# Patient Record
Sex: Male | Born: 1965 | Race: Black or African American | Hispanic: No | Marital: Married | Smoking: Current every day smoker
Health system: Southern US, Community
[De-identification: ages and names within clinical notes are randomized; demographics above are authoritative.]

## PROBLEM LIST (undated history)

## (undated) DIAGNOSIS — M5030 Other cervical disc degeneration, unspecified cervical region: Secondary | ICD-10-CM

## (undated) DIAGNOSIS — E785 Hyperlipidemia, unspecified: Secondary | ICD-10-CM

## (undated) DIAGNOSIS — G8929 Other chronic pain: Secondary | ICD-10-CM

## (undated) DIAGNOSIS — N50819 Testicular pain, unspecified: Secondary | ICD-10-CM

## (undated) DIAGNOSIS — I10 Essential (primary) hypertension: Secondary | ICD-10-CM

## (undated) DIAGNOSIS — K209 Esophagitis, unspecified: Secondary | ICD-10-CM

## (undated) DIAGNOSIS — Z720 Tobacco use: Secondary | ICD-10-CM

## (undated) DIAGNOSIS — M545 Low back pain, unspecified: Secondary | ICD-10-CM

## (undated) DIAGNOSIS — M47812 Spondylosis without myelopathy or radiculopathy, cervical region: Secondary | ICD-10-CM

## (undated) DIAGNOSIS — K2270 Barrett's esophagus without dysplasia: Secondary | ICD-10-CM

## (undated) HISTORY — DX: Esophagitis, unspecified: K20.9

## (undated) HISTORY — DX: Low back pain: M54.5

## (undated) HISTORY — DX: Essential (primary) hypertension: I10

## (undated) HISTORY — DX: Low back pain, unspecified: M54.50

## (undated) HISTORY — DX: Testicular pain, unspecified: N50.819

## (undated) HISTORY — DX: Hyperlipidemia, unspecified: E78.5

## (undated) HISTORY — DX: Other cervical disc degeneration, unspecified cervical region: M50.30

## (undated) HISTORY — DX: Tobacco use: Z72.0

## (undated) HISTORY — DX: Other chronic pain: G89.29

## (undated) HISTORY — DX: Barrett's esophagus without dysplasia: K22.70

## (undated) HISTORY — DX: Spondylosis without myelopathy or radiculopathy, cervical region: M47.812
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
## 2001-09-07 ENCOUNTER — Emergency Department (HOSPITAL_COMMUNITY): Admission: EM | Admit: 2001-09-07 | Discharge: 2001-09-07 | Payer: Self-pay | Admitting: Emergency Medicine

## 2002-01-20 ENCOUNTER — Emergency Department (HOSPITAL_COMMUNITY): Admission: EM | Admit: 2002-01-20 | Discharge: 2002-01-20 | Payer: Self-pay | Admitting: Emergency Medicine

## 2002-07-12 ENCOUNTER — Emergency Department (HOSPITAL_COMMUNITY): Admission: EM | Admit: 2002-07-12 | Discharge: 2002-07-13 | Payer: Self-pay | Admitting: Emergency Medicine

## 2003-11-22 ENCOUNTER — Encounter: Admission: RE | Admit: 2003-11-22 | Discharge: 2003-11-22 | Payer: Self-pay | Admitting: Internal Medicine

## 2005-05-27 ENCOUNTER — Emergency Department (HOSPITAL_COMMUNITY): Admission: EM | Admit: 2005-05-27 | Discharge: 2005-05-27 | Payer: Self-pay | Admitting: Emergency Medicine

## 2005-06-07 ENCOUNTER — Emergency Department (HOSPITAL_COMMUNITY): Admission: EM | Admit: 2005-06-07 | Discharge: 2005-06-08 | Payer: Self-pay | Admitting: Emergency Medicine

## 2005-12-21 ENCOUNTER — Emergency Department (HOSPITAL_COMMUNITY): Admission: EM | Admit: 2005-12-21 | Discharge: 2005-12-21 | Payer: Self-pay | Admitting: Emergency Medicine

## 2008-05-24 ENCOUNTER — Ambulatory Visit: Payer: Self-pay | Admitting: Diagnostic Radiology

## 2008-05-24 ENCOUNTER — Telehealth: Payer: Self-pay | Admitting: Internal Medicine

## 2008-05-24 ENCOUNTER — Ambulatory Visit (HOSPITAL_BASED_OUTPATIENT_CLINIC_OR_DEPARTMENT_OTHER): Admission: RE | Admit: 2008-05-24 | Discharge: 2008-05-24 | Payer: Self-pay | Admitting: Internal Medicine

## 2008-05-24 ENCOUNTER — Ambulatory Visit: Payer: Self-pay | Admitting: Internal Medicine

## 2008-05-24 DIAGNOSIS — R5381 Other malaise: Secondary | ICD-10-CM

## 2008-05-24 DIAGNOSIS — A64 Unspecified sexually transmitted disease: Secondary | ICD-10-CM | POA: Insufficient documentation

## 2008-05-24 DIAGNOSIS — F17200 Nicotine dependence, unspecified, uncomplicated: Secondary | ICD-10-CM

## 2008-05-24 DIAGNOSIS — R5383 Other fatigue: Secondary | ICD-10-CM

## 2008-05-24 LAB — CONVERTED CEMR LAB
ALT: 42 units/L (ref 0–53)
AST: 28 units/L (ref 0–37)
Albumin: 4.3 g/dL (ref 3.5–5.2)
Alkaline Phosphatase: 66 units/L (ref 39–117)
BUN: 10 mg/dL (ref 6–23)
Basophils Absolute: 0 10*3/uL (ref 0.0–0.1)
Basophils Relative: 0.4 % (ref 0.0–3.0)
Bilirubin, Direct: 0.1 mg/dL (ref 0.0–0.3)
CO2: 32 meq/L (ref 19–32)
Calcium: 9.5 mg/dL (ref 8.4–10.5)
Chloride: 105 meq/L (ref 96–112)
Cholesterol: 253 mg/dL — ABNORMAL HIGH (ref 0–200)
Creatinine, Ser: 0.9 mg/dL (ref 0.4–1.5)
Direct LDL: 192.3 mg/dL
Eosinophils Absolute: 0.1 10*3/uL (ref 0.0–0.7)
Eosinophils Relative: 2.2 % (ref 0.0–5.0)
Free T4: 0.9 ng/dL (ref 0.6–1.6)
GFR calc non Af Amer: 119.94 mL/min (ref >60)
Glucose, Bld: 96 mg/dL (ref 70–99)
HCT: 42.5 % (ref 39.0–52.0)
HDL: 27.2 mg/dL — ABNORMAL LOW (ref >39.00)
Hemoglobin: 14.5 g/dL (ref 13.0–17.0)
Lymphocytes Relative: 37.3 % (ref 12.0–46.0)
Lymphs Abs: 2.1 10*3/uL (ref 0.7–4.0)
MCHC: 34.2 g/dL (ref 30.0–36.0)
MCV: 82.5 fL (ref 78.0–100.0)
Monocytes Absolute: 0.3 10*3/uL (ref 0.1–1.0)
Monocytes Relative: 5.2 % (ref 3.0–12.0)
Neutro Abs: 3.2 10*3/uL (ref 1.4–7.7)
Neutrophils Relative %: 54.9 % (ref 43.0–77.0)
Platelets: 238 10*3/uL (ref 150.0–400.0)
Potassium: 4.1 meq/L (ref 3.5–5.1)
RBC: 5.15 M/uL (ref 4.22–5.81)
RDW: 14 % (ref 11.5–14.6)
Sodium: 142 meq/L (ref 135–145)
TSH: 0.92 microintl units/mL (ref 0.35–5.50)
Total Bilirubin: 0.8 mg/dL (ref 0.3–1.2)
Total CHOL/HDL Ratio: 9
Total Protein: 7.2 g/dL (ref 6.0–8.3)
Triglycerides: 114 mg/dL (ref 0.0–149.0)
VLDL: 22.8 mg/dL (ref 0.0–40.0)
WBC: 5.7 10*3/uL (ref 4.5–10.5)

## 2008-05-25 ENCOUNTER — Encounter: Payer: Self-pay | Admitting: Internal Medicine

## 2008-05-25 DIAGNOSIS — I10 Essential (primary) hypertension: Secondary | ICD-10-CM | POA: Insufficient documentation

## 2008-05-25 LAB — CONVERTED CEMR LAB
HCV Ab: NEGATIVE
Hep B S Ab: NEGATIVE
Hepatitis B Surface Ag: NEGATIVE

## 2008-06-07 ENCOUNTER — Ambulatory Visit: Payer: Self-pay | Admitting: Internal Medicine

## 2008-06-07 DIAGNOSIS — E785 Hyperlipidemia, unspecified: Secondary | ICD-10-CM | POA: Insufficient documentation

## 2008-06-07 LAB — CONVERTED CEMR LAB
Cholesterol, target level: 200 mg/dL
HDL goal, serum: 40 mg/dL
LDL Goal: 130 mg/dL

## 2008-09-14 ENCOUNTER — Ambulatory Visit: Payer: Self-pay | Admitting: Internal Medicine

## 2008-11-25 ENCOUNTER — Ambulatory Visit: Payer: Self-pay | Admitting: Internal Medicine

## 2008-12-24 ENCOUNTER — Ambulatory Visit: Payer: Self-pay | Admitting: Internal Medicine

## 2008-12-24 LAB — CONVERTED CEMR LAB
CRP, High Sensitivity: 1.4
Cholesterol: 205 mg/dL — ABNORMAL HIGH (ref 0–200)
HDL: 29 mg/dL — ABNORMAL LOW (ref >39)
LDL Cholesterol: 148 mg/dL — ABNORMAL HIGH (ref 0–99)
Total CHOL/HDL Ratio: 7.1
Triglycerides: 140 mg/dL (ref <150)
VLDL: 28 mg/dL (ref 0–40)

## 2008-12-31 ENCOUNTER — Ambulatory Visit: Payer: Self-pay | Admitting: Internal Medicine

## 2009-03-28 ENCOUNTER — Ambulatory Visit: Payer: Self-pay | Admitting: Internal Medicine

## 2009-04-26 ENCOUNTER — Telehealth (INDEPENDENT_AMBULATORY_CARE_PROVIDER_SITE_OTHER): Payer: Self-pay | Admitting: *Deleted

## 2009-05-02 ENCOUNTER — Telehealth: Payer: Self-pay | Admitting: Internal Medicine

## 2009-05-30 ENCOUNTER — Ambulatory Visit: Payer: Self-pay | Admitting: Radiology

## 2009-05-30 ENCOUNTER — Emergency Department (HOSPITAL_BASED_OUTPATIENT_CLINIC_OR_DEPARTMENT_OTHER): Admission: EM | Admit: 2009-05-30 | Discharge: 2009-05-30 | Payer: Self-pay | Admitting: Emergency Medicine

## 2009-05-30 ENCOUNTER — Ambulatory Visit: Payer: Self-pay | Admitting: Internal Medicine

## 2009-05-30 DIAGNOSIS — K029 Dental caries, unspecified: Secondary | ICD-10-CM | POA: Insufficient documentation

## 2009-05-30 DIAGNOSIS — R079 Chest pain, unspecified: Secondary | ICD-10-CM

## 2009-10-21 ENCOUNTER — Telehealth: Payer: Self-pay | Admitting: Internal Medicine

## 2010-03-04 ENCOUNTER — Encounter: Payer: Self-pay | Admitting: Internal Medicine

## 2010-03-14 NOTE — Progress Notes (Signed)
Summary: Rx to quit smoking  Phone Note Call from Patient Call back at Home Phone [phone]   Caller: Patient Call For: [name] DO Summary of Call: patient called and left voice message wanting to know if he could get a rx to help him to quit smoking Initial call taken by: Glendell Docker CMA,  October 21, 2009 1:14 PM  Follow-up for Phone Call        I suggest pt use nicotine patches over the counter Follow-up by: [name] DO,  October 25, 2009 6:21 PM  Additional Follow-up for Phone Call Additional follow up Details #1::        call returned to patient at [phone], he has been advised per Dr Artist Pais instructions Additional Follow-up by: Glendell Docker CMA,  October 26, 2009 8:27 AM

## 2010-03-14 NOTE — Progress Notes (Signed)
Summary: REFILL REQUEST  Phone Note Refill Request Message from:  Patient on April 26, 2009 4:05 PM  Refills Requested: Medication #1:  LOVASTATIN 20 MG TABS one by mouth qpm.   Dosage confirmed as above?Dosage Confirmed   Brand Name Necessary? No   Supply Requested: 1 month [facility] [address]    Method Requested: Electronic Next Appointment Scheduled: NONE  Initial call taken by: Roselle Locus,  April 26, 2009 4:05 PM  Follow-up for Phone Call        Spoke to pt. at 4:20pm and advised him that message had been left on [facility] voicemail advising them of the refill we submitted on 03/28/09. Advised pt. to call us back if he has problem with the pharmacy. Follow-up by: Mervin Kung CMA,  April 26, 2009 4:26 PM

## 2010-03-14 NOTE — Assessment & Plan Note (Signed)
Summary: pain in upper left side/mhf   Vital Signs:  Patient profile:   45 year old male Height:      64 inches Weight:      188.50 pounds BMI:     32.47 Temp:     98.3 degrees F oral Pulse rate:   72 / minute Pulse rhythm:   regular Resp:     18 per minute BP sitting:   130 / 80  (left arm) Cuff size:   large  Vitals Entered By: Glendell Docker CMA (May 30, 2009 2:06 PM) CC: Rm 2- pain under arms Comments c/o constant sharp pain under arms for the past 2 days, ongoing intensity varies, need dental referral for 2 extractions   Primary Care Provider:  DThomos Lemons DO  CC:  Rm 2- pain under arms.  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     Physical Exam  General:  alert, well-developed, and well-nourished.   Ears:  R ear normal and L ear normal.   Mouth:  dental caries x 2 (upper molars) Neck:  supple, no masses, no cervical lymphadenopathy, and no neck tenderness.   Lungs:  normal  respiratory effort, normal breath sounds, no crackles, and no wheezes.   Heart:  normal rate, regular rhythm, no murmur, and no gallop.   Abdomen:  soft, non-tender, and normal bowel sounds.   Msk:  left upper arm, mildly tender Axillary Nodes:  no R axillary adenopathy and no L axillary adenopathy.     Impression & Recommendations:  Problem # 1:  CHEST PAIN, ACUTE (ICD-786.50) 45 y/o AA male with multiple risk factors c/o chest pain and left arm pain.  no obvious sign of infection.  I advised furhter w/u in ER  Orders: EKG w/ Interpretation (93000)  Problem # 2:  DENTAL CARIES (ICD-521.00) pt advised to fu with dentist/oral surgeon.   tx with augmentin  Complete Medication List: 1)  Lovastatin 20 Mg Tabs (Lovastatin) .... One by mouth qpm 2)  Amoxicillin-pot Clavulanate 875-125 Mg Tabs (Amoxicillin-pot clavulanate) .... One by mouth bid Prescriptions: AMOXICILLIN-POT CLAVULANATE 875-125 MG TABS (AMOXICILLIN-POT CLAVULANATE) one by mouth bid  #14 x 0   Entered and Authorized by:   [name] DO   Signed by:   [name] DO on 05/30/2009   Method used:  Electronically to        [facility] [address] (retail)       [phone]       [place], [place]  [address]       Ph: [phone]       Fax: [phone]   RxID:   [id number]   Current Allergies (reviewed today): No known allergies

## 2010-03-14 NOTE — Assessment & Plan Note (Signed)
Summary: 3 month follow up/mhf   Vital Signs:  Patient profile:   45 year old Billy [name] Weight:      190.50 pounds BMI:     32.82 O2 Sat:      100 % on Room air Temp:     98.4 degrees F oral Pulse rate:   80 / minute Pulse rhythm:   regular Resp:     18 per minute BP sitting:   124 / 70  (left arm) Cuff size:   large  Vitals Entered By: Glendell Docker CMA (March 28, 2009 4:15 PM)  O2 Flow:  Room air  Primary Care Provider:  [name] DO  CC:  3 Month Follow up .  History of Present Illness: 3 Month Follow up   Hyperlipidemia Follow-Up      This is a 45 year old man who presents for Hyperlipidemia follow-up.  The patient reports muscle aches and GI upset.  The patient denies the following symptoms: chest pain/pressure.  Compliance with medications (by patient report) has been intermittent.  Dietary compliance has been fair.  he finished first month of lovastatin and never got refilled.   no side effects while taking lovastatin still smoking. precomtemplative   Allergies (verified): No Known Drug Allergies  Past History:  Past Medical History: Hx of elevated BP w/o diagnosis of hypertension  Hx of left testicular discomfort       Scrotum u/s 11/2003 - No intratestciular abnormality      5 mm right epididymal cyst       Suspect small varicoceles bilaterally  Hx of cervical spondylosis     Tobacco user  Family History: Father died of lung ca Mother - hx of depression "mental breakdown" Colon ca - no Prostate ca - no       Social History: Occupation:Electrician Single in a relationship   daughter age 64 lives in [place]    Current Smoker  Alcohol use-no  (hx of alcohol abuse - stopped drinking in 2000) Hx of marijuana use  Denies high risk sexual contact.    Physical Exam  General:  alert, well-developed, and well-nourished.   Neck:  No deformities, masses, or tenderness noted. Lungs:  normal respiratory effort and normal breath sounds.   Heart:  normal  rate, regular rhythm, and no gallop.     Impression & Recommendations:  Problem # 1:  HYPERLIPIDEMIA (ICD-272.4) Assessment Unchanged I urged compliance.  Repeat labs before next office visit.  His updated medication list for this problem includes:    Lovastatin 20 Mg Tabs (Lovastatin) ..... One by mouth qpm  Problem # 2:  TOBACCO ABUSE (ICD-305.1)  Encouraged smoking cessation and discussed different methods for smoking cessation.   Complete Medication List: 1)  Lovastatin 20 Mg Tabs (Lovastatin) .... One by mouth qpm  Patient Instructions: 1)  Please schedule a follow-up appointment in 4 months. 2)  AST, ALT prior to visit, ICD-9: 272.4 3)  Lipid Panel prior to visit, ICD-9: 272.4 4)  Please return for lab work one (1) week before your next appointment.  Prescriptions: LOVASTATIN 20 MG TABS (LOVASTATIN) one by mouth qpm  #90 x 1   Entered and Authorized by:   [name] DO   Signed by:   [name] DO on 03/28/2009   Method used:   Electronically to        [facility] [address] (retail)       [phone]  [place], [place]  [address]       Ph: [phone]       Fax: [phone]   RxID:   [id number]    Orders Added: 1)  Est. Patient Level III [34742]   Current Allergies (reviewed today): No known allergies

## 2010-03-14 NOTE — Progress Notes (Signed)
Summary: Lovastatin Refill  Phone Note Call from Patient   Caller: Patient Call For: [name] DO Reason for Call: Refill Medication Summary of Call: patient was a walk in to check on his rx for Lovastatin, He states that the pharmacy did not have his refill for his Lovastatin. Patient was informed rx would be re-sent to pharmacy. If he had any problems with medication, refill, he was advised to call Initial call taken by: Glendell Docker CMA,  May 02, 2009 4:35 PM    Prescriptions: LOVASTATIN 20 MG TABS (LOVASTATIN) one by mouth qpm  #90 x 1   Entered by:   Glendell Docker CMA   Authorized by:   [name] DO   Signed by:   Glendell Docker CMA on 05/02/2009   Method used:   Electronically to        [facility] [address] (retail)       [phone]       [place], [place]  [address]       Ph: [phone]       Fax: [phone]   RxID:   [id number]

## 2010-05-02 LAB — BASIC METABOLIC PANEL
BUN: 19 mg/dL (ref 6–23)
CO2: 30 mEq/L (ref 19–32)
Calcium: 9.4 mg/dL (ref 8.4–10.5)
Chloride: 105 mEq/L (ref 96–112)
Creatinine, Ser: 1 mg/dL (ref 0.4–1.5)
GFR calc Af Amer: >60 mL/min (ref >60)
GFR calc non Af Amer: >60 mL/min (ref >60)
Glucose, Bld: 81 mg/dL (ref 70–99)
Potassium: 4.4 mEq/L (ref 3.5–5.1)
Sodium: 145 mEq/L (ref 135–145)

## 2010-05-02 LAB — CBC
HCT: 42.8 % (ref 39.0–52.0)
Hemoglobin: 14.1 g/dL (ref 13.0–17.0)
MCHC: 33 g/dL (ref 30.0–36.0)
MCV: 82.1 fL (ref 78.0–100.0)
Platelets: 234 10*3/uL (ref 150–400)
RBC: 5.22 MIL/uL (ref 4.22–5.81)
RDW: 13.7 % (ref 11.5–15.5)
WBC: 6.1 10*3/uL (ref 4.0–10.5)

## 2010-05-02 LAB — POCT CARDIAC MARKERS
CKMB, poc: <1 ng/mL — ABNORMAL LOW (ref 1.0–8.0)
Myoglobin, poc: 48.8 ng/mL (ref 12–200)
Troponin i, poc: <0.05 ng/mL (ref 0.00–0.09)

## 2010-05-02 LAB — DIFFERENTIAL
Basophils Absolute: 0 10*3/uL (ref 0.0–0.1)
Basophils Relative: 1 % (ref 0–1)
Eosinophils Absolute: 0.2 10*3/uL (ref 0.0–0.7)
Eosinophils Relative: 3 % (ref 0–5)
Lymphocytes Relative: 34 % (ref 12–46)
Lymphs Abs: 2.1 10*3/uL (ref 0.7–4.0)
Monocytes Absolute: 0.5 10*3/uL (ref 0.1–1.0)
Monocytes Relative: 8 % (ref 3–12)
Neutro Abs: 3.3 10*3/uL (ref 1.7–7.7)
Neutrophils Relative %: 54 % (ref 43–77)

## 2010-08-01 ENCOUNTER — Encounter: Payer: Self-pay | Admitting: Family Medicine

## 2010-08-01 ENCOUNTER — Ambulatory Visit (INDEPENDENT_AMBULATORY_CARE_PROVIDER_SITE_OTHER): Payer: Self-pay | Admitting: Family Medicine

## 2010-08-01 ENCOUNTER — Encounter: Payer: Self-pay | Admitting: Internal Medicine

## 2010-08-01 ENCOUNTER — Ambulatory Visit: Payer: Self-pay | Admitting: Internal Medicine

## 2010-08-01 VITALS — BP 148/101 | HR 71 | Temp 98.4°F | Resp 20 | Ht 64.0 in | Wt 202.0 lb

## 2010-08-01 DIAGNOSIS — E785 Hyperlipidemia, unspecified: Secondary | ICD-10-CM

## 2010-08-01 DIAGNOSIS — F419 Anxiety disorder, unspecified: Secondary | ICD-10-CM

## 2010-08-01 DIAGNOSIS — F411 Generalized anxiety disorder: Secondary | ICD-10-CM

## 2010-08-01 DIAGNOSIS — M79629 Pain in unspecified upper arm: Secondary | ICD-10-CM

## 2010-08-01 DIAGNOSIS — M79609 Pain in unspecified limb: Secondary | ICD-10-CM

## 2010-08-01 MED ORDER — LOVASTATIN 20 MG PO TABS: 20.0000 mg | ORAL_TABLET | Freq: Every evening | ORAL | Status: DC

## 2010-08-01 NOTE — Patient Instructions (Signed)
Check bp at home a few times per week. Normal bp is less than or equal to 140/90.

## 2010-08-03 DIAGNOSIS — M79629 Pain in unspecified upper arm: Secondary | ICD-10-CM | POA: Insufficient documentation

## 2010-08-03 NOTE — Progress Notes (Signed)
OFFICE VISIT  08/03/2010   CC:  Chief Complaint  Patient presents with  . Arm Problem    c/o bilateral underam pain     HPI:    Patient is a 45 y.o. African-American male who presents for underarm discomfort. Has been happening on/off for years, feels vague bilateral discomfort and fullness in underarm areas.  No itching, no pain, no redness.  No mass palpable.  No neck pain, no pain in shoulders, no chest pain, no SOB, no chest tightness or coughing, no jaw or arm pain, no nausea or diaphoresis. Seems to notice it a lot more when under a lot of stress.  When not working and under no stress, he doesn't feel it. No history of neck, upper body, or arm injury.  Reports history of abnormal neck x-ray in the past (years ago), describes some disc space narrowing was noted.  Occasionally gets to neck pain but not in connection with his underarm symptoms.  Says he has not checked his bp anywhere out of a medical setting.  Admits to feeling a bit anxious at the beginning of today's visit.  Past Medical History  Diagnosis Date  . Elevated blood pressure reading without diagnosis of hypertension   . 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 Cervical spondylosis     history of  . Tobacco user   Hyperlipidemia  History reviewed. No pertinent past surgical history.  No outpatient prescriptions prior to visit.  Mevacor 20mg  qd  No Known Allergies  ROS As per HPI  PE: Blood pressure 148/101, pulse 71, temperature 98.4 F (36.9 C), temperature source Oral, resp. rate 20, height 5\' 4"  (1.626 m), weight 202 lb (91.627 kg). Gen: Alert, well appearing.  Patient is oriented to person, place, time, and situation. HEENT: Scalp without lesions or hair loss.  Ears: EACs clear, normal epithelium.  TMs with good light reflex and landmarks bilaterally.  Eyes: no injection, icteris,  swelling, or exudate.  EOMI, PERRLA. Nose: no drainage or turbinate edema/swelling.  No injection or focal lesion.  Mouth: lips without lesion/swelling.  Oral mucosa pink and moist.  Dentition intact and without obvious caries or gingival swelling.  Oropharynx without erythema, exudate, or swelling.  Neck: supple, ROM full.  Carotids 2+ bilat, without bruit.  No lymphadenopathy, thyromegaly, or mass. Chest: symmetric expansion, nonlabored respirations.  Clear and equal breath sounds in all lung fields.   CV: RRR, no m/r/g.  Peripheral pulses 2+ and symmetric. Axillae: no rash or other skin abnormality.  Hair growth normal.  No tenderness, no mass, no soft tissue swelling or fullness is visible or palpable.   UEs: strength 5/5 prox/dist.  No sensory deficits.   LABS:  None today  IMPRESSION AND PLAN:  Axillary pain Vague discomfort sensation.   No objective finding.  No red flags for severe underlying pathology. Seems clearly connected to anxiety/stress.  Reassured patient.  Return if symptom worsens or new symptoms develop.     FOLLOW UP: No Follow-up on file.

## 2010-08-03 NOTE — Assessment & Plan Note (Signed)
Vague discomfort sensation.   No objective finding.  No red flags for severe underlying pathology. Seems clearly connected to anxiety/stress.  Reassured patient.  Return if symptom worsens or new symptoms develop.

## 2011-02-13 DIAGNOSIS — I10 Essential (primary) hypertension: Secondary | ICD-10-CM

## 2011-02-13 HISTORY — DX: Essential (primary) hypertension: I10

## 2011-12-26 ENCOUNTER — Telehealth: Payer: Self-pay | Admitting: Internal Medicine

## 2011-12-26 NOTE — Telephone Encounter (Signed)
yes

## 2011-12-26 NOTE — Telephone Encounter (Signed)
Called pts spouse and lft a vm stating that Dr Artist Pais has agreed to re-est with pt, even if self pay, and to call back to sch ov.

## 2011-12-26 NOTE — Telephone Encounter (Signed)
Pts spouse called and said that her husband last saw Dr Artist Pais at [facility] in 07/2010. Pt no longer has insurance, so would be self pay. Is it ok for pt to re-est?

## 2011-12-26 NOTE — Telephone Encounter (Signed)
Called pts spouse and sch pt cpx for 01/03/12 and fasting lab for 12/27/11 as noted.

## 2011-12-27 ENCOUNTER — Other Ambulatory Visit: Payer: Self-pay

## 2011-12-28 ENCOUNTER — Other Ambulatory Visit (INDEPENDENT_AMBULATORY_CARE_PROVIDER_SITE_OTHER): Payer: Self-pay

## 2011-12-28 DIAGNOSIS — Z0000 Encounter for general adult medical examination without abnormal findings: Secondary | ICD-10-CM

## 2011-12-28 LAB — HEPATIC FUNCTION PANEL
ALT: 36 U/L (ref 0–53)
AST: 22 U/L (ref 0–37)
Albumin: 4.4 g/dL (ref 3.5–5.2)
Alkaline Phosphatase: 75 U/L (ref 39–117)
Bilirubin, Direct: 0.1 mg/dL (ref 0.0–0.3)
Total Bilirubin: 0.8 mg/dL (ref 0.3–1.2)
Total Protein: 7.1 g/dL (ref 6.0–8.3)

## 2011-12-28 LAB — BASIC METABOLIC PANEL
BUN: 16 mg/dL (ref 6–23)
CO2: 27 mEq/L (ref 19–32)
Calcium: 9.6 mg/dL (ref 8.4–10.5)
Chloride: 104 mEq/L (ref 96–112)
Creatinine, Ser: 0.8 mg/dL (ref 0.4–1.5)
GFR: 127.65 mL/min (ref >60.00)
Glucose, Bld: 88 mg/dL (ref 70–99)
Potassium: 4.5 mEq/L (ref 3.5–5.1)
Sodium: 137 mEq/L (ref 135–145)

## 2011-12-28 LAB — POCT URINALYSIS DIPSTICK
Bilirubin, UA: NEGATIVE
Blood, UA: NEGATIVE
Glucose, UA: NEGATIVE
Ketones, UA: NEGATIVE
Leukocytes, UA: NEGATIVE
Nitrite, UA: NEGATIVE
Protein, UA: NEGATIVE
Spec Grav, UA: 1.02
Urobilinogen, UA: 0.2
pH, UA: 5.5

## 2011-12-28 LAB — CBC WITH DIFFERENTIAL/PLATELET
Basophils Absolute: 0.1 10*3/uL (ref 0.0–0.1)
Eosinophils Absolute: 0.2 10*3/uL (ref 0.0–0.7)
Eosinophils Relative: 4.5 % (ref 0.0–5.0)
HCT: 44.4 % (ref 39.0–52.0)
Hemoglobin: 14.5 g/dL (ref 13.0–17.0)
Lymphocytes Relative: 40.8 % (ref 12.0–46.0)
Lymphs Abs: 2.3 10*3/uL (ref 0.7–4.0)
MCHC: 32.7 g/dL (ref 30.0–36.0)
MCV: 82 fl (ref 78.0–100.0)
Monocytes Absolute: 0.3 10*3/uL (ref 0.1–1.0)
Monocytes Relative: 6 % (ref 3.0–12.0)
Neutrophils Relative %: 47.8 % (ref 43.0–77.0)
Platelets: 242 10*3/uL (ref 150.0–400.0)
RBC: 5.42 Mil/uL (ref 4.22–5.81)
RDW: 15.6 % — ABNORMAL HIGH (ref 11.5–14.6)

## 2011-12-28 LAB — LIPID PANEL
Cholesterol: 224 mg/dL — ABNORMAL HIGH (ref 0–200)
HDL: 31 mg/dL — ABNORMAL LOW (ref >39.00)
Total CHOL/HDL Ratio: 7
Triglycerides: 160 mg/dL — ABNORMAL HIGH (ref 0.0–149.0)
VLDL: 32 mg/dL (ref 0.0–40.0)

## 2011-12-28 LAB — PSA: PSA: 0.76 ng/mL (ref 0.10–4.00)

## 2011-12-28 LAB — TSH: TSH: 0.79 u[IU]/mL (ref 0.35–5.50)

## 2011-12-28 LAB — LDL CHOLESTEROL, DIRECT: Direct LDL: 153.7 mg/dL

## 2012-01-03 ENCOUNTER — Encounter: Payer: Self-pay | Admitting: Internal Medicine

## 2012-01-09 ENCOUNTER — Encounter: Payer: Self-pay | Admitting: Internal Medicine

## 2012-01-09 ENCOUNTER — Ambulatory Visit (INDEPENDENT_AMBULATORY_CARE_PROVIDER_SITE_OTHER): Payer: Self-pay | Admitting: Internal Medicine

## 2012-01-09 VITALS — BP 132/94 | HR 72 | Temp 98.8°F | Ht 67.0 in | Wt 200.0 lb

## 2012-01-09 DIAGNOSIS — Z0000 Encounter for general adult medical examination without abnormal findings: Secondary | ICD-10-CM

## 2012-01-09 DIAGNOSIS — R1031 Right lower quadrant pain: Secondary | ICD-10-CM

## 2012-01-09 DIAGNOSIS — R200 Anesthesia of skin: Secondary | ICD-10-CM | POA: Insufficient documentation

## 2012-01-09 DIAGNOSIS — R209 Unspecified disturbances of skin sensation: Secondary | ICD-10-CM

## 2012-01-09 MED ORDER — LOSARTAN POTASSIUM 50 MG PO TABS: 50.0000 mg | ORAL_TABLET | Freq: Every day | ORAL | Status: DC

## 2012-01-09 MED ORDER — PRAVASTATIN SODIUM 40 MG PO TABS: 40.0000 mg | ORAL_TABLET | Freq: Every day | ORAL | Status: DC

## 2012-01-09 NOTE — Progress Notes (Signed)
Subjective:    Patient ID: Billy [name], male    DOB: 06/09/1965, 46 y.o.   MRN: [id number]  HPI  46 year old [place] American male with history of hyperlipidemia and tobacco use for routine followup. Patient denies any significant interval history. He was seen in summer of 2013 by Dr. Othella Boyer mild tenderness in his axillary area. Patient still having persistent symptoms of unclear etiology.  Patient also complains of intermittent right lower quadrant tenderness. He has internal hemorrhoids.  He experiences occasional rectal bleeding.  He also complains of intermittent numbness of right lower arm. Review of Systems  Constitutional: Negative for activity change, appetite change and unexpected weight change.  Eyes: Negative for visual disturbance.  Respiratory: Negative for cough, chest tightness and shortness of breath.   Cardiovascular: Negative for chest pain.  Genitourinary: Negative for difficulty urinating.  Neurological: Negative for headaches.  Gastrointestinal: Negative for heartburn or melena  Psych: Negative for depression or anxiety Endo:  No polyuria or polydypsia       Past Medical History  Diagnosis Date  . Elevated blood pressure reading without diagnosis of hypertension   . 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 Cervical spondylosis     history of  . Tobacco user     History   Social History  . Marital Status: Single    Spouse Name: N/A    Number of Children: N/A  . Years of Education: N/A   Occupational History  . Not on file.   Social History Main Topics  . Smoking status: Current Every Day Smoker  . Smokeless tobacco: Not on file  . Alcohol Use: No     Comment: hx of alcohol abuse-stopped drinking in 2000  . Drug Use: Yes     Comment: history marijuana use  . Sexually Active: Not on file   Other Topics Concern  .  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       No past surgical history on file.  Family History  Problem Relation Age of Onset  . Lung cancer Father     died from lung cancer  . Depression Mother     history of depression "mental breakdown"  . Other Neg Hx     no colon ca, no prostate ca    No Known Allergies  Current Outpatient Prescriptions on File Prior to Visit  Medication Sig Dispense Refill  . losartan (COZAAR) 50 MG tablet Take 1 tablet (50 mg total) by mouth daily.  90 tablet  1  . pravastatin (PRAVACHOL) 40 MG tablet Take 1 tablet (40 mg total) by mouth daily.  90 tablet  1    BP 132/94  Pulse 72  Temp 98.8 F (37.1 C) (Oral)  Ht 5\' 7"  (1.702 m)  Wt 200 lb (90.719 kg)  BMI 31.32 kg/m2    Objective:   Physical Exam  Constitutional: He is oriented to person, place, and time. He appears well-developed and well-nourished.  HENT:  Head: Normocephalic and atraumatic.  Right Ear: External ear normal.  Left Ear: External ear normal.  Mouth/Throat: Oropharynx is clear and moist.  Eyes: EOM are normal. Pupils are equal, round, and reactive to light.  Neck: Neck supple.  Cardiovascular: Normal rate, regular rhythm, normal heart sounds and intact distal pulses.   No murmur heard. Pulmonary/Chest: Effort normal and breath sounds normal. He has no wheezes.  Abdominal: Soft. Bowel sounds are normal. There is no tenderness.  Genitourinary: Rectum normal, prostate normal and penis normal.  Musculoskeletal: He exhibits no edema.  Lymphadenopathy:       Head (right side): No submandibular, no preauricular and no posterior auricular adenopathy present.       Head (left side): No submandibular, no preauricular and no posterior auricular adenopathy present.    He has  no cervical adenopathy.       Right cervical: No superficial cervical and no deep cervical adenopathy present.      Left cervical: No superficial cervical and no deep cervical adenopathy present.       Right axillary: No pectoral and no lateral adenopathy present.       Left axillary: No pectoral and no lateral adenopathy present. Neurological: He is alert and oriented to person, place, and time. No cranial nerve deficit.  Skin: Skin is warm and dry.  Psychiatric: He has a normal mood and affect. His behavior is normal.          Assessment & Plan:

## 2012-01-09 NOTE — Assessment & Plan Note (Signed)
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 He may have carpal tunnel syndrome versus pronator syndrome. Patient advised to try using wrist splint for 1 to 2 months.  Patient advised to call office if symptoms persist or worsen.

## 2012-01-09 NOTE — Assessment & Plan Note (Addendum)
Routine physical and 46 year old [place] American male with tobacco use, hyperlipidemia and hypertension. Patient advised to start losartan 50 mg once daily.  Also take pravastatin 40 mg once daily.  Arrange followup labs in 2 months.  Patient strongly encouraged to discontinue smoking.  He has internal hemorrhoids that intermittently bleed.  Patient advised to use bulk laxative.  Check iFOB.  Patient defers colonoscopy due to financial reasons.

## 2012-01-09 NOTE — Patient Instructions (Addendum)
Stop smoking.  You can use nicotine lozenges or gums. Please complete the following lab tests before your next follow up appointment: BMET - 401.9

## 2012-01-29 ENCOUNTER — Other Ambulatory Visit: Payer: Self-pay

## 2012-01-29 LAB — FECAL OCCULT BLOOD, IMMUNOCHEMICAL: Fecal Occult Bld: POSITIVE

## 2012-02-07 ENCOUNTER — Ambulatory Visit: Payer: Self-pay | Admitting: Internal Medicine

## 2012-02-08 ENCOUNTER — Encounter: Payer: Self-pay | Admitting: Internal Medicine

## 2012-02-08 ENCOUNTER — Ambulatory Visit (INDEPENDENT_AMBULATORY_CARE_PROVIDER_SITE_OTHER): Payer: Self-pay | Admitting: Internal Medicine

## 2012-02-08 VITALS — BP 142/92 | Temp 98.8°F | Wt 194.0 lb

## 2012-02-08 DIAGNOSIS — I10 Essential (primary) hypertension: Secondary | ICD-10-CM

## 2012-02-08 DIAGNOSIS — R195 Other fecal abnormalities: Secondary | ICD-10-CM | POA: Insufficient documentation

## 2012-02-08 DIAGNOSIS — R200 Anesthesia of skin: Secondary | ICD-10-CM

## 2012-02-08 DIAGNOSIS — R209 Unspecified disturbances of skin sensation: Secondary | ICD-10-CM

## 2012-02-08 NOTE — Assessment & Plan Note (Signed)
Impoved. Continue losartan 50 mg.  Monitor BMET.

## 2012-02-08 NOTE — Patient Instructions (Addendum)
Call our office when you are ready to proceed with gastroenterology referral

## 2012-02-08 NOTE — Progress Notes (Signed)
  Subjective:    Patient ID: Billy [name], male    DOB: 06-30-1965, 46 y.o.   MRN: [id number]  HPI  45 year old African American male with history of hypertension and hyperlipidemia for follow up regarding rectal bleeding. Patient reports rectal bleeding has resolved. He still has unexplained right lower quadrant tenderness/discomfort. Patient completed immunoassay fecal occult blood. This was positive. He denies any family history of colon cancer.   Review of Systems Negative for weight change.  Stools are usually brown in color  Past Medical History  Diagnosis Date  . Elevated blood pressure reading without diagnosis of hypertension   . 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 Cervical spondylosis     history of  . Tobacco user     History   Social History  . Marital Status: Single    Spouse Name: N/A    Number of Children: N/A  . Years of Education: N/A   Occupational History  . Not on file.   Social History Main Topics  . Smoking status: Current Every Day Smoker  . Smokeless tobacco: Not on file  . Alcohol Use: No     Comment: hx of alcohol abuse-stopped drinking in 2000  . Drug Use: Yes     Comment: history marijuana use  . Sexually Active: Not on file   Other Topics Concern  . 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       No past surgical history on file.  Family History  Problem Relation Age of Onset  . Lung cancer Father     died from lung cancer  . Depression Mother     history of depression "mental breakdown"  . Other Neg Hx     no colon ca, no prostate ca    No Known Allergies  Current Outpatient Prescriptions on  File Prior to Visit  Medication Sig Dispense Refill  . losartan (COZAAR) 50 MG tablet Take 1 tablet (50 mg total) by mouth daily.  90 tablet  1  . pravastatin (PRAVACHOL) 40 MG tablet Take 1 tablet (40 mg total) by mouth daily.  90 tablet  1    BP 142/92  Temp 98.8 F (37.1 C) (Oral)  Wt 194 lb (87.998 kg)       Objective:   Physical Exam  Constitutional: He appears well-developed and well-nourished.  Cardiovascular: Normal rate, regular rhythm and normal heart sounds.   Pulmonary/Chest: Effort normal and breath sounds normal.  Abdominal: Soft. Bowel sounds are normal.       RLQ tenderness  Genitourinary: Rectum normal. Guaiac negative stool.       No rectal mass          Assessment & Plan:

## 2012-02-08 NOTE — Assessment & Plan Note (Signed)
46 year old African American male has unexplained right lower quadrant pain and heme positive stools. I recommended referral to GI for colonoscopy. He currently does not have medical insurance but is in the process of signing up for insurance.  Patient will contact our office as soon as he obtains health insurance.  He understands importance of completing evaluation to rule out colon cancer.

## 2012-02-12 ENCOUNTER — Telehealth: Payer: Self-pay | Admitting: Internal Medicine

## 2012-02-12 ENCOUNTER — Other Ambulatory Visit (INDEPENDENT_AMBULATORY_CARE_PROVIDER_SITE_OTHER): Payer: Self-pay

## 2012-02-12 DIAGNOSIS — R195 Other fecal abnormalities: Secondary | ICD-10-CM

## 2012-02-12 DIAGNOSIS — R1031 Right lower quadrant pain: Secondary | ICD-10-CM

## 2012-02-12 DIAGNOSIS — R200 Anesthesia of skin: Secondary | ICD-10-CM

## 2012-02-12 DIAGNOSIS — I10 Essential (primary) hypertension: Secondary | ICD-10-CM

## 2012-02-12 LAB — BASIC METABOLIC PANEL
BUN: 14 mg/dL (ref 6–23)
CO2: 29 mEq/L (ref 19–32)
Calcium: 9.2 mg/dL (ref 8.4–10.5)
Chloride: 104 mEq/L (ref 96–112)
Creatinine, Ser: 1 mg/dL (ref 0.4–1.5)
GFR: 105.54 mL/min (ref >60.00)
Glucose, Bld: 152 mg/dL — ABNORMAL HIGH (ref 70–99)
Potassium: 4.4 mEq/L (ref 3.5–5.1)
Sodium: 141 mEq/L (ref 135–145)

## 2012-02-12 NOTE — Telephone Encounter (Signed)
Please place order for CT of abd and pelvis with oral and IV contrast re: RLQ pain and heme positive stools

## 2012-02-12 NOTE — Telephone Encounter (Signed)
Patient's spouse called stating that he would like to be referred to [facility] for an MRI of his abdomin as he is still having pains. Please advise and assist.

## 2012-02-12 NOTE — Telephone Encounter (Signed)
Orders placed.  Left message on machine for patient.

## 2012-02-22 ENCOUNTER — Encounter: Payer: Self-pay | Admitting: Internal Medicine

## 2012-02-22 MED ORDER — AMLODIPINE BESYLATE 5 MG PO TABS: 5.0000 mg | ORAL_TABLET | Freq: Every day | ORAL | Status: DC

## 2012-03-29 ENCOUNTER — Other Ambulatory Visit: Payer: Self-pay

## 2012-12-18 ENCOUNTER — Other Ambulatory Visit: Payer: Self-pay

## 2013-01-28 ENCOUNTER — Telehealth: Payer: Self-pay | Admitting: Internal Medicine

## 2013-01-28 NOTE — Telephone Encounter (Signed)
Pt needs refill on losartan call into [facility] [address]

## 2013-01-28 NOTE — Telephone Encounter (Signed)
Pt's wife called back, do not sent to [facility] [address], please send to [facility] on [address].

## 2013-01-28 NOTE — Telephone Encounter (Signed)
Pt needs an OV, schedule appt then we can call him in enough to last till next appt

## 2013-01-28 NOTE — Telephone Encounter (Signed)
lmom for wife to call back. 

## 2013-01-29 MED ORDER — LOSARTAN POTASSIUM 50 MG PO TABS: 50.0000 mg | ORAL_TABLET | Freq: Every day | ORAL | Status: DC

## 2013-01-29 NOTE — Telephone Encounter (Signed)
rx sent in electroncially 

## 2013-01-29 NOTE — Telephone Encounter (Signed)
Pt has appt with [facility] for separate issue, wanting to know if bp med can be filled since he is being seen tomorrow due to Dr. Artist Pais not having any appts available.

## 2013-01-30 ENCOUNTER — Encounter: Payer: Self-pay | Admitting: Family

## 2013-01-30 ENCOUNTER — Other Ambulatory Visit: Payer: Self-pay | Admitting: Family

## 2013-01-30 ENCOUNTER — Ambulatory Visit (INDEPENDENT_AMBULATORY_CARE_PROVIDER_SITE_OTHER): Payer: No Typology Code available for payment source | Admitting: Family

## 2013-01-30 VITALS — BP 160/98 | HR 71 | Wt 196.0 lb

## 2013-01-30 DIAGNOSIS — I10 Essential (primary) hypertension: Secondary | ICD-10-CM

## 2013-01-30 DIAGNOSIS — M545 Low back pain, unspecified: Secondary | ICD-10-CM

## 2013-01-30 DIAGNOSIS — M542 Cervicalgia: Secondary | ICD-10-CM

## 2013-01-30 DIAGNOSIS — Z720 Tobacco use: Secondary | ICD-10-CM

## 2013-01-30 DIAGNOSIS — M549 Dorsalgia, unspecified: Secondary | ICD-10-CM

## 2013-01-30 DIAGNOSIS — F17200 Nicotine dependence, unspecified, uncomplicated: Secondary | ICD-10-CM

## 2013-01-30 DIAGNOSIS — R209 Unspecified disturbances of skin sensation: Secondary | ICD-10-CM

## 2013-01-30 DIAGNOSIS — R200 Anesthesia of skin: Secondary | ICD-10-CM

## 2013-01-30 LAB — COMPREHENSIVE METABOLIC PANEL
AST: 23 U/L (ref 0–37)
Albumin: 4.4 g/dL (ref 3.5–5.2)
BUN: 17 mg/dL (ref 6–23)
CO2: 30 mEq/L (ref 19–32)
Calcium: 9.2 mg/dL (ref 8.4–10.5)
Chloride: 105 mEq/L (ref 96–112)
Creatinine, Ser: 0.9 mg/dL (ref 0.4–1.5)
GFR: 123.62 mL/min (ref >60.00)
Glucose, Bld: 81 mg/dL (ref 70–99)
Potassium: 4.1 mEq/L (ref 3.5–5.1)

## 2013-01-30 LAB — CBC WITH DIFFERENTIAL/PLATELET
Basophils Absolute: 0.1 10*3/uL (ref 0.0–0.1)
HCT: 40.7 % (ref 39.0–52.0)
Lymphs Abs: 3.1 10*3/uL (ref 0.7–4.0)
MCV: 80.4 fl (ref 78.0–100.0)
Monocytes Absolute: 0.5 10*3/uL (ref 0.1–1.0)
Monocytes Relative: 6.6 % (ref 3.0–12.0)
Neutrophils Relative %: 42.8 % — ABNORMAL LOW (ref 43.0–77.0)
Platelets: 282 10*3/uL (ref 150.0–400.0)
RDW: 15.1 % — ABNORMAL HIGH (ref 11.5–14.6)

## 2013-01-30 LAB — TSH: TSH: 1.81 u[IU]/mL (ref 0.35–5.50)

## 2013-01-30 MED ORDER — LOSARTAN POTASSIUM 50 MG PO TABS: 50.0000 mg | ORAL_TABLET | Freq: Every day | ORAL | Status: DC

## 2013-01-30 MED ORDER — AMLODIPINE BESYLATE 5 MG PO TABS: 5.0000 mg | ORAL_TABLET | Freq: Every day | ORAL | Status: DC

## 2013-01-30 NOTE — Patient Instructions (Signed)

## 2013-01-30 NOTE — Progress Notes (Signed)
Pre visit review using our clinic review tool, if applicable. No additional management support is needed unless otherwise documented below in the visit note. 

## 2013-01-30 NOTE — Progress Notes (Signed)
Subjective:    Patient ID: Billy [name], male    DOB: 10/06/1965, 47 y.o.   MRN: [id number]  HPI  47 year old [place] American male, smoker, patient of Dr. Marquis Lunch. is in today with complaint of body numbness and tingling ongoing 2-3 years. Patient reports a history of low back pain and actively has low back pain that improves with wearing a back brace and taking ibuprofen. He reports a demanding job that required heavy lifting in the past. However, he has a job is pretty sedentary now. Patient also reports lower shin the numbness and tingling is worse when he lays on his abdomen. Patient also reports numbness and tingling when he extends his neck. Reports marijuana smoking helps.  Review of Systems  Constitutional: Negative.   Respiratory: Negative.   Cardiovascular: Negative.   Endocrine: Negative.   Genitourinary: Negative.   Musculoskeletal: Positive for back pain.       Chronic low back pain  Skin: Negative.   Neurological:       Reports numbness in his hands and lower exterminator bilaterally.  Psychiatric/Behavioral: Negative.    Past Medical History  Diagnosis Date  . Elevated blood pressure reading without diagnosis of hypertension   . 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 Cervical spondylosis     history of  . Tobacco user     History   Social History  . Marital Status: Single    Spouse Name: N/A    Number of Children: N/A  . Years of Education: N/A   Occupational History  . Not on file.   Social History Main Topics  . Smoking status: Current Every Day Smoker  . Smokeless tobacco: Not on file  . Alcohol Use: No     Comment: hx of alcohol abuse-stopped drinking in 2000  . Drug Use: Yes     Comment: history marijuana use  . Sexual Activity: Not on file   Other Topics Concern  . Not on file   Social History Narrative   Occupation:Electrician   Single in a relationship     daughter age 54 lives in [place]      Current Smoker    Alcohol use-no  (hx of alcohol abuse - stopped drinking in 2000)   Hx of marijuana use    Denies high risk sexual contact.       History reviewed. No pertinent past surgical history.  Family History  Problem Relation Age of Onset  . Lung cancer Father     died from lung cancer  . Depression Mother     history of depression "mental breakdown"  . Other Neg Hx     no colon ca, no prostate ca    No Known Allergies  Current Outpatient Prescriptions on File Prior to Visit  Medication Sig Dispense Refill  . pravastatin (PRAVACHOL) 40 MG tablet Take 1 tablet (40 mg total) by mouth daily.  90 tablet  1   No current facility-administered medications on file prior to visit.    BP 160/98  Pulse 71  Wt 196 lb (88.905 kg)chart    Objective:   Physical Exam  Constitutional: He is oriented to person, place, and time. He appears well-developed and well-nourished.  Neck: Normal range of motion. Neck supple.  Cardiovascular: Normal rate, regular rhythm and normal heart sounds.   Pulmonary/Chest: Effort normal and breath sounds normal.  Abdominal: Soft. Bowel sounds are  normal.  Musculoskeletal: Normal range of motion. He exhibits no tenderness.  No tenderness to palpation of the L-spine. Straight leg raise maneuver negative. Pedal pulses 2/2. No tenderness to palpation of the cervical spine. Pain with extension of the neck. Otherwise normal range of motion.  Neurological: He is alert and oriented to person, place, and time. He has normal reflexes. He displays normal reflexes. No cranial nerve deficit. Coordination normal.  Skin: Skin is warm and dry.  Psychiatric: He has a normal mood and affect.          Assessment & Plan:  Assessment: 1. Numbness and tingling 2. Lumbar radiculopathy 3. Tobacco abuse  Plan: Lab sent to include ESR, BMP, CBC, TSH will notify patient of  results. We'll send for an x-ray of the lumbar and cervical spine. Offered prescription for ibuprofen 800 mg the patient declines. Encouraged low back strengthening exercises to reduce pain and inflammation. We'll follow the patient pending images and labs.

## 2013-02-12 DIAGNOSIS — M5030 Other cervical disc degeneration, unspecified cervical region: Secondary | ICD-10-CM

## 2013-02-12 HISTORY — DX: Other cervical disc degeneration, unspecified cervical region: M50.30

## 2013-07-07 ENCOUNTER — Telehealth: Payer: Self-pay | Admitting: Internal Medicine

## 2013-07-07 MED ORDER — LOSARTAN POTASSIUM 50 MG PO TABS: 50.0000 mg | ORAL_TABLET | Freq: Every day | ORAL | Status: DC

## 2013-07-07 MED ORDER — AMLODIPINE BESYLATE 5 MG PO TABS: 5.0000 mg | ORAL_TABLET | Freq: Every day | ORAL | Status: DC

## 2013-07-07 NOTE — Telephone Encounter (Signed)
rx's sent in electronically 

## 2013-07-07 NOTE — Telephone Encounter (Signed)
Pt needs refill of losartan (COZAAR) 50 MG tablet And  amLODipine (NORVASC) 5 MG tablet [facility] Pt has made appt for 6/8

## 2013-07-20 ENCOUNTER — Ambulatory Visit: Payer: Self-pay | Admitting: Internal Medicine

## 2013-07-30 ENCOUNTER — Telehealth: Payer: Self-pay | Admitting: Internal Medicine

## 2013-07-30 NOTE — Telephone Encounter (Signed)
Pt would like a referral to a rheumatologist due to having the same ongoing pain.  Pt not better.

## 2013-07-31 NOTE — Telephone Encounter (Signed)
Please schedule appt with Dr Artist PaisYoo

## 2013-07-31 NOTE — Telephone Encounter (Signed)
I suggest OV with us first before referral to rheumatologist.

## 2013-08-07 ENCOUNTER — Encounter: Payer: Self-pay | Admitting: Internal Medicine

## 2013-08-07 ENCOUNTER — Ambulatory Visit (INDEPENDENT_AMBULATORY_CARE_PROVIDER_SITE_OTHER): Payer: Self-pay | Admitting: Internal Medicine

## 2013-08-07 VITALS — BP 140/70 | Temp 98.8°F | Ht 67.0 in | Wt 194.0 lb

## 2013-08-07 DIAGNOSIS — K4090 Unilateral inguinal hernia, without obstruction or gangrene, not specified as recurrent: Secondary | ICD-10-CM

## 2013-08-07 DIAGNOSIS — I10 Essential (primary) hypertension: Secondary | ICD-10-CM

## 2013-08-07 DIAGNOSIS — G8929 Other chronic pain: Secondary | ICD-10-CM

## 2013-08-07 DIAGNOSIS — R200 Anesthesia of skin: Secondary | ICD-10-CM

## 2013-08-07 DIAGNOSIS — R1031 Right lower quadrant pain: Secondary | ICD-10-CM

## 2013-08-07 DIAGNOSIS — M542 Cervicalgia: Secondary | ICD-10-CM

## 2013-08-07 DIAGNOSIS — R109 Unspecified abdominal pain: Secondary | ICD-10-CM

## 2013-08-07 DIAGNOSIS — R209 Unspecified disturbances of skin sensation: Secondary | ICD-10-CM

## 2013-08-07 LAB — BASIC METABOLIC PANEL
BUN: 21 mg/dL (ref 6–23)
CHLORIDE: 104 meq/L (ref 96–112)
CO2: 28 meq/L (ref 19–32)
CREATININE: 1 mg/dL (ref 0.4–1.5)
Calcium: 9.4 mg/dL (ref 8.4–10.5)
GFR: 102.45 mL/min (ref >60.00)
Glucose, Bld: 83 mg/dL (ref 70–99)
Potassium: 4.1 mEq/L (ref 3.5–5.1)
Sodium: 141 mEq/L (ref 135–145)

## 2013-08-07 LAB — VITAMIN B12: Vitamin B-12: 953 pg/mL — ABNORMAL HIGH (ref 211–911)

## 2013-08-07 MED ORDER — LOSARTAN POTASSIUM 50 MG PO TABS: 50.0000 mg | ORAL_TABLET | Freq: Every day | ORAL | Status: DC

## 2013-08-07 MED ORDER — PRAVASTATIN SODIUM 40 MG PO TABS: 40.0000 mg | ORAL_TABLET | Freq: Every day | ORAL | Status: DC

## 2013-08-07 MED ORDER — AMLODIPINE BESYLATE 5 MG PO TABS: 5.0000 mg | ORAL_TABLET | Freq: Every day | ORAL | Status: DC

## 2013-08-07 NOTE — Assessment & Plan Note (Signed)
Patient has not been taking his blood pressure medications. Restart losartan amlodipine. BP: 140/70 mmHg

## 2013-08-07 NOTE — Assessment & Plan Note (Addendum)
48 year old African American male complains of bilateral hand numbness. His symptoms worse in the right hand versus left. Numbness also localized to the fourth and fifth digit of each hand. 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 His symptoms may be also secondary to possible cervical spinal stenosis. Obtain MRI of Cervical spine are with and without contrast.  Consider neurology consultation.  Previous TSH and sed rate normal.  Check B12 level.

## 2013-08-07 NOTE — Assessment & Plan Note (Signed)
Patient has intermittent pulling sensation in his right groin. Patient states his symptoms radiate upward toward his back. No obvious direct inguinal hernia however patient may have indirect hernia. Refer to surgery for further evaluation.

## 2013-08-07 NOTE — Progress Notes (Signed)
Subjective:    Patient ID: Billy [name], male    DOB: 12/13/1965, 48 y.o.   MRN: [id number]  HPI  48 year old [place] male with history of hypertension, tobacco use and hyperlipidemia for followup. Interval medical history-patient seen by nurse practitioner in December of 2014 for complaints of numbness and tingling in his hands and low back pain. Patient reports he has persistent symptoms. His hand numbness worse in right arm and localized to the fourth and fifth digits of each hand. Patient is accompanied by his wife and sister. Report patient's father has history of severe degenerative disc disease of cervical spine.  Patient complains of a shocklike sensation when he hyperextends his cervical spine.  Patient denies any upper extremity weakness.  He also complains of intermittent pulling sensation in his right groin area. His symptoms seem to improve when he wears a back brace.   Review of Systems Negative for visual changes, negative for gait disorder.  Negative for fever or chills.  Negative for changes in bowel habits. Negative for diarrhea.    Past Medical History  Diagnosis Date  . Elevated blood pressure reading without diagnosis of hypertension   . 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 Cervical spondylosis     history of  . Tobacco user     History   Social History  . Marital Status: Single    Spouse Name: N/A    Number of Children: N/A  . Years of Education: N/A   Occupational History  . Not on file.   Social History Main Topics  . Smoking status: Current Every Day Smoker  . Smokeless tobacco: Not on file  . Alcohol Use: No     Comment: hx of alcohol abuse-stopped drinking in 2000  . Drug Use: Yes     Comment: history marijuana use  . Sexual Activity: Not on file   Other Topics Concern  . Not on file   Social  History Narrative   Occupation:Electrician   Single in a relationship     daughter age 46 lives in [place]      Current Smoker    Alcohol use-no  (hx of alcohol abuse - stopped drinking in 2000)   Hx of marijuana use    Denies high risk sexual contact.       No past surgical history on file.  Family History  Problem Relation Age of Onset  . Lung cancer Father     died from lung cancer  . Depression Mother     history of depression "mental breakdown"  . Other Neg Hx     no colon ca, no prostate ca    No Known Allergies  No current outpatient prescriptions on file prior to visit.   No current facility-administered medications on file prior to visit.    BP 140/70  Temp(Src) 98.8 F (37.1 C) (Oral)  Ht 5\' 7"  (1.702 m)  Wt 194 lb (87.998 kg)  BMI 30.38 kg/m2      Objective:   Physical Exam  Constitutional: He is oriented to person, place, and time. He appears well-developed and well-nourished. No distress.  HENT:  Head: Normocephalic and atraumatic.  Right Ear: External ear normal.  Left Ear: External ear normal.  Mouth/Throat: Oropharynx is clear and moist.  Eyes: Conjunctivae and EOM are normal. Pupils are equal, round, and reactive to light.  Neck: Neck supple.  Cardiovascular:  Normal rate, regular rhythm and normal heart sounds.   No murmur heard. Pulmonary/Chest: Effort normal and breath sounds normal. He has no wheezes.  Abdominal: Soft. Bowel sounds are normal. He exhibits no mass. There is no tenderness.  Genitourinary:  Impulse palpable with coughing with index finger in right spermatic ring.  Musculoskeletal:  Muscle strength 5 out of 5 throughout  Lymphadenopathy:    He has no cervical adenopathy.  Neurological: He is alert and oriented to person, place, and time. He has normal reflexes. He displays normal reflexes. No cranial nerve deficit. He exhibits normal muscle tone.  Skin: Skin is warm and dry.  Psychiatric: He has a normal mood and affect. His  behavior is normal.       Assessment & Plan:

## 2013-08-07 NOTE — Progress Notes (Signed)
Pre visit review using our clinic review tool, if applicable. No additional management support is needed unless otherwise documented below in the visit note. 

## 2013-08-12 NOTE — Telephone Encounter (Signed)
done

## 2013-08-20 ENCOUNTER — Ambulatory Visit
Admission: RE | Admit: 2013-08-20 | Discharge: 2013-08-20 | Disposition: A | Discharge status: Home / Self Care | Payer: No Typology Code available for payment source | Source: Ambulatory Visit | Attending: Internal Medicine | Admitting: Internal Medicine

## 2013-08-20 ENCOUNTER — Other Ambulatory Visit: Payer: Self-pay | Admitting: Internal Medicine

## 2013-08-20 DIAGNOSIS — R209 Unspecified disturbances of skin sensation: Secondary | ICD-10-CM

## 2013-08-20 DIAGNOSIS — G8929 Other chronic pain: Secondary | ICD-10-CM

## 2013-08-20 DIAGNOSIS — R200 Anesthesia of skin: Secondary | ICD-10-CM

## 2013-08-20 DIAGNOSIS — M5022 Other cervical disc displacement, mid-cervical region, unspecified level: Secondary | ICD-10-CM

## 2013-08-20 DIAGNOSIS — M542 Cervicalgia: Secondary | ICD-10-CM

## 2013-08-20 MED ORDER — GADOBENATE DIMEGLUMINE 529 MG/ML IV SOLN: 17.0000 mL | Freq: Once | INTRAVENOUS | Status: DC | PRN

## 2013-08-24 ENCOUNTER — Ambulatory Visit (INDEPENDENT_AMBULATORY_CARE_PROVIDER_SITE_OTHER): Payer: Self-pay | Admitting: Surgery

## 2013-08-25 ENCOUNTER — Ambulatory Visit (INDEPENDENT_AMBULATORY_CARE_PROVIDER_SITE_OTHER): Payer: Self-pay | Admitting: Surgery

## 2013-09-03 ENCOUNTER — Ambulatory Visit (INDEPENDENT_AMBULATORY_CARE_PROVIDER_SITE_OTHER): Payer: Self-pay | Admitting: Surgery

## 2013-09-11 ENCOUNTER — Ambulatory Visit: Payer: Self-pay | Admitting: Internal Medicine

## 2013-09-18 ENCOUNTER — Ambulatory Visit: Payer: Self-pay | Admitting: Internal Medicine

## 2013-09-18 DIAGNOSIS — Z0289 Encounter for other administrative examinations: Secondary | ICD-10-CM

## 2013-09-21 ENCOUNTER — Ambulatory Visit: Payer: Self-pay | Admitting: Family Medicine

## 2013-10-05 ENCOUNTER — Telehealth: Payer: Self-pay | Admitting: Internal Medicine

## 2013-10-05 NOTE — Telephone Encounter (Signed)
Pt was dx with bulging disc and needs pain med. [facility] [address]

## 2013-10-06 NOTE — Telephone Encounter (Signed)
Pt was referred to a neurosurgeon.  Does he need to get med from there. Left message for pt to call back

## 2013-10-06 NOTE — Telephone Encounter (Signed)
Call in gabapentin 100 mg # 30 one po qhs.  RF x 1.  He can titrate to 200 mg or 2 caps at bedtime within 1 - 2 weeks.  Needs OV in 4 weeks.

## 2013-10-06 NOTE — Telephone Encounter (Signed)
Pts wife called back and stated that he never seen the neurosurgeon cause he does not have insurance.  Please advise

## 2013-10-07 MED ORDER — GABAPENTIN 100 MG PO CAPS: ORAL_CAPSULE | ORAL | Status: DC

## 2013-10-07 NOTE — Telephone Encounter (Signed)
pts wife aware, she will have pt call back and schedule an OV

## 2013-12-03 ENCOUNTER — Other Ambulatory Visit: Payer: Self-pay | Admitting: Internal Medicine

## 2013-12-16 ENCOUNTER — Telehealth: Payer: Self-pay | Admitting: Internal Medicine

## 2013-12-16 MED ORDER — GABAPENTIN 300 MG PO CAPS
300.0000 mg | ORAL_CAPSULE | Freq: Two times a day (BID) | ORAL | Status: DC
Start: 2013-12-16 — End: 2014-03-10

## 2013-12-16 NOTE — Telephone Encounter (Signed)
Change gabapentin to 300 mg # 60 one po bid. RF x 1.  Needs OV with 6-8 wks.

## 2013-12-16 NOTE — Telephone Encounter (Signed)
Pt aware, rx sent in electronically 

## 2013-12-16 NOTE — Telephone Encounter (Signed)
Pt wife called to say that pt is asking if he can increase the med that he is taking for his back pain . Wife did not know what the rx was .

## 2014-02-03 ENCOUNTER — Telehealth: Payer: Self-pay | Admitting: Internal Medicine

## 2014-02-03 NOTE — Telephone Encounter (Signed)
Schedule appt with Dr Artist PaisYoo and we can give him enough to get through appt

## 2014-02-03 NOTE — Telephone Encounter (Signed)
Pt said if he can not see Dr Artist PaisYoo between now and 02/14/14 he will not be able to come to an appt. 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

## 2014-02-03 NOTE — Telephone Encounter (Signed)
Dr Artist PaisYoo is out of the office next week.  If he needs to be seen before 02/14/14 then he will have to see another provider.  Nothing I can do since this is Dr Olegario MessierYoo's last day till the 02/15/14

## 2014-02-03 NOTE — Telephone Encounter (Signed)
Pt was told he needed an office visit when he received the refill  gabapentin (NEURONTIN) 300 MG capsule   Pt wife said he works out of town and will be leaving on 02/14/13 can the pt follow up  with someone else or does he need to see Dr Artist PaisYoo

## 2014-03-08 ENCOUNTER — Telehealth: Payer: Self-pay | Admitting: Internal Medicine

## 2014-03-08 NOTE — Telephone Encounter (Signed)
Ok with me 

## 2014-03-08 NOTE — Telephone Encounter (Signed)
Patient would like to transfer from Dr. Artist PaisYoo to Sandford CrazeMelissa O'Sullivan. Is this ok? Thanks!

## 2014-03-09 NOTE — Telephone Encounter (Signed)
Patient scheduled appointment with Melissa.

## 2014-03-10 ENCOUNTER — Encounter: Payer: Self-pay | Admitting: Family Medicine

## 2014-03-10 ENCOUNTER — Ambulatory Visit: Payer: No Typology Code available for payment source | Admitting: Internal Medicine

## 2014-03-10 ENCOUNTER — Ambulatory Visit (INDEPENDENT_AMBULATORY_CARE_PROVIDER_SITE_OTHER): Payer: Self-pay | Admitting: Family Medicine

## 2014-03-10 VITALS — BP 140/80 | HR 82 | Temp 98.5°F | Wt 201.0 lb

## 2014-03-10 DIAGNOSIS — R202 Paresthesia of skin: Secondary | ICD-10-CM

## 2014-03-10 DIAGNOSIS — R208 Other disturbances of skin sensation: Secondary | ICD-10-CM

## 2014-03-10 MED ORDER — GABAPENTIN 300 MG PO CAPS: 300.0000 mg | ORAL_CAPSULE | Freq: Three times a day (TID) | ORAL | Status: DC

## 2014-03-10 NOTE — Progress Notes (Signed)
Pre visit review using our clinic review tool, if applicable. No additional management support is needed unless otherwise documented below in the visit note. 

## 2014-03-10 NOTE — Patient Instructions (Signed)
We will call you with neurology referral. 

## 2014-03-10 NOTE — Progress Notes (Signed)
Subjective:    Patient ID: Billy [name], male    DOB: 04-15-1965, [age] y.o.   MRN: [id number]  HPI Patient seen as a work in with some chronic symptoms of upper extremity paresthesias without associated pain. He apparently has had at least couple of years of numbness mostly involving the fourth and fifth digits of both hands but occasionally all digits of the hand. He is complaining in the past with occasional shock sensation with either extreme neck flexion or extension. He had MRI of the cervical spine last July which showed diffuse degenerative changes. He was referred to neurosurgeon but apparently never went. He denies any upper extremity weakness. No history of muscle atrophy. He's had multiple labs including B12, TSH, sedimentation rate all normal.  Patient also complains of several month history of burning sensation very poorly localized up and down much of his right and left side (right > left). He's never had any associated rash. He describes a burning type quality of pain which is moderate and occasionally severe. He was placed on gabapentin currently 300 mg twice a day. Initially this seemed to help but has not helped as much recently. He's never been evaluated by neurology. He denies any lower extremity symptoms.  Chronic problems include hypertension, hyperlipidemia, nicotine use  Past Medical History  Diagnosis Date  . Elevated blood pressure reading without diagnosis of hypertension   . 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 Cervical spondylosis     history of  . Tobacco user    No past surgical history on file.  reports that he has been smoking.  He does not have any smokeless tobacco history on file. He reports that he uses illicit drugs. He reports that he does not drink alcohol. family history includes Depression in his mother; Lung cancer in his  father. There is no history of Other. No Known Allergies    Review of Systems  Constitutional: Negative for appetite change, fatigue and unexpected weight change.  Eyes: Negative for visual disturbance.  Respiratory: Negative for cough, chest tightness and shortness of breath.   Cardiovascular: Negative for chest pain, palpitations and leg swelling.  Neurological: Positive for numbness. Negative for dizziness, seizures, syncope, weakness, light-headedness and headaches.       Objective:   Physical Exam  Constitutional: He is oriented to person, place, and time. He appears well-developed and well-nourished.  HENT:  Right Ear: External ear normal.  Left Ear: External ear normal.  Mouth/Throat: Oropharynx is clear and moist.  Eyes: Pupils are equal, round, and reactive to light.  Neck: Neck supple. No thyromegaly present.  Cardiovascular: Normal rate and regular rhythm.   Pulmonary/Chest: Effort normal and breath sounds normal. No respiratory distress. He has no wheezes. He has no rales.  Musculoskeletal: He exhibits no edema.  Neurological: He is alert and oriented to person, place, and time. No cranial nerve deficit. Coordination normal.  Deep tendon reflexes are symmetric upper extremities. No muscle atrophy. Full strength upper extremities.  Skin: No rash noted.          Assessment & Plan:  Patient resents with somewhat chronic paresthesias involving upper extremities without associated pain or weakness. ?secondary to cervical spondylosis. He also complains of some very poorly localized painful dysesthesias involving his trunk region. We'll titrate gabapentin to 300 mg 3 times a day. At this point, feel neurology consultation is warranted and we'll go  ahead and set up

## 2014-03-30 ENCOUNTER — Other Ambulatory Visit: Payer: Self-pay | Admitting: Internal Medicine

## 2014-04-08 ENCOUNTER — Ambulatory Visit: Payer: No Typology Code available for payment source | Admitting: Neurology

## 2014-04-12 ENCOUNTER — Ambulatory Visit: Payer: No Typology Code available for payment source | Admitting: Family

## 2014-04-12 ENCOUNTER — Telehealth: Payer: Self-pay | Admitting: Neurology

## 2014-04-12 NOTE — Telephone Encounter (Signed)
Pt no showed 04/08/14 NP appt w Dr. Allena KatzPatel. Referring provider's office notified via EP[facility] referral. No show letter + no show policy mailed to pt / Sherri S.

## 2014-04-19 ENCOUNTER — Encounter: Payer: Self-pay | Admitting: *Deleted

## 2014-05-14 DIAGNOSIS — K209 Esophagitis, unspecified without bleeding: Secondary | ICD-10-CM

## 2014-05-14 DIAGNOSIS — K2270 Barrett's esophagus without dysplasia: Secondary | ICD-10-CM

## 2014-05-14 HISTORY — DX: Barrett's esophagus without dysplasia: K22.70

## 2014-05-14 HISTORY — DX: Esophagitis, unspecified without bleeding: K20.90

## 2014-12-21 ENCOUNTER — Encounter: Payer: Self-pay | Admitting: Internal Medicine

## 2014-12-21 ENCOUNTER — Ambulatory Visit: Payer: Self-pay | Admitting: Internal Medicine

## 2015-01-05 ENCOUNTER — Ambulatory Visit (INDEPENDENT_AMBULATORY_CARE_PROVIDER_SITE_OTHER): Payer: Self-pay | Admitting: Internal Medicine

## 2015-01-05 ENCOUNTER — Encounter: Payer: Self-pay | Admitting: Internal Medicine

## 2015-01-05 VITALS — BP 146/90 | HR 64 | Resp 16 | Ht 64.0 in | Wt 184.0 lb

## 2015-01-05 DIAGNOSIS — K2100 Gastro-esophageal reflux disease with esophagitis, without bleeding: Secondary | ICD-10-CM

## 2015-01-05 DIAGNOSIS — I10 Essential (primary) hypertension: Secondary | ICD-10-CM

## 2015-01-05 MED ORDER — AMLODIPINE BESYLATE 5 MG PO TABS: 5.0000 mg | ORAL_TABLET | Freq: Every day | ORAL | Status: DC

## 2015-01-05 MED ORDER — OMEPRAZOLE 20 MG PO CPDR: DELAYED_RELEASE_CAPSULE | ORAL | Status: DC

## 2015-01-05 NOTE — Patient Instructions (Signed)
Go somewhere in a week and check your blood pressure and call it into the clinic.

## 2015-01-05 NOTE — Progress Notes (Signed)
   Subjective:    Patient ID: Billy [name], male    DOB: 1965/08/07, 49 y.o.   MRN: [id number]  HPI  1.  Hypertension: Taking Metoprolol twice daily religiously. Hasn't had bp check done anywhere else other than when seen ind ED on 12/23/2014 for chest pain.  Pt. States they felt he had gastritis/GERD.    2.  GERD:  Was given Pepcid 20 mg twice daily in ED above.  Also, trying other otc remedies for constipation/gastritis.  STates the OMeprazole 20 mg helped at first, then stopped working--stomach and back pain returned.   Did elevate head of bed.  Still smoking.    Review of Systems     Objective:   Physical Exam HEENT:  PERRL, EOMI, throat without injection.   Neck:  Supple, no adenopathy or thyromegaly Chest:  CTA CV:  RRR with normal S1 and S2, No S3, S4, or murmur appreciated.  No LE edema Abd:  S, NT, No HSM or mass, +BS throughout     Assessment & Plan:  1.  Hypertension:  NOt well controlled on Metoprolol and limited by pulse in titrating dose.  Add Amlodipine 5 mg.  2.  GERD:  Needs to stop smoking, but hanging out with others at work who smoke.  Other GERD precautions given.  Increase OMeprazole to 40 mg daily on empty stomach. To call for follow up when back in town.

## 2015-01-12 ENCOUNTER — Telehealth: Payer: Self-pay | Admitting: Internal Medicine

## 2015-01-12 NOTE — Telephone Encounter (Signed)
[name]  (Nurse from [facility] [employer] in [place] ) left a voicemail. Stated Billy CornfieldChristopher [name] was seen at the doctor's office around Thanksgiving and was given a new Rx for Amlodipine. Nurse wants to know if patient should still take his Metroprolol 25 mg tablet along with the new Amlodipine so. Patient has been taking both medications daily and  is complaining of nauseous. Direct number to reach nurse is [phone]. Please advise.  Dr. Delrae AlfredMulberry called nurse and left a voicemail with information and her personal # if any other questions or concerns. PATIENT TO TAKE BOTH MEDICATION DAILY.

## 2015-01-26 NOTE — Telephone Encounter (Signed)
Back to Billy [name] nurse never called back. Left message again with same information

## 2015-06-20 ENCOUNTER — Ambulatory Visit: Payer: Self-pay | Admitting: Internal Medicine

## 2015-06-27 ENCOUNTER — Encounter: Payer: Self-pay | Admitting: Internal Medicine

## 2015-06-27 ENCOUNTER — Ambulatory Visit (INDEPENDENT_AMBULATORY_CARE_PROVIDER_SITE_OTHER): Payer: Self-pay | Admitting: Internal Medicine

## 2015-06-27 VITALS — BP 132/80 | HR 60 | Resp 14 | Ht 65.75 in | Wt 188.0 lb

## 2015-06-27 DIAGNOSIS — I10 Essential (primary) hypertension: Secondary | ICD-10-CM

## 2015-06-27 DIAGNOSIS — E785 Hyperlipidemia, unspecified: Secondary | ICD-10-CM

## 2015-06-27 DIAGNOSIS — K219 Gastro-esophageal reflux disease without esophagitis: Secondary | ICD-10-CM

## 2015-06-27 DIAGNOSIS — M5030 Other cervical disc degeneration, unspecified cervical region: Secondary | ICD-10-CM

## 2015-06-27 MED ORDER — METOPROLOL TARTRATE 25 MG PO TABS: 25.0000 mg | ORAL_TABLET | Freq: Two times a day (BID) | ORAL | Status: DC

## 2015-06-27 MED ORDER — AMLODIPINE BESYLATE 5 MG PO TABS: 5.0000 mg | ORAL_TABLET | Freq: Every day | ORAL | Status: DC

## 2015-06-27 MED ORDER — FAMOTIDINE 20 MG PO TABS: ORAL_TABLET | ORAL | Status: DC

## 2015-06-27 NOTE — Progress Notes (Signed)
   Subjective:    Patient ID: Billy [name], male    DOB: 1965-08-14, 50 y.o.   MRN: [id number]  HPI   1.  Hands numb all the time--ulnar aspect.  Left hand for 6-8 months.  Right hand for years.  No neck pain.  Has numbness and tingling as well and feel weaker.  Has been sleeping with arms up above head as instructed by another physician.  He will be here locally for a while due to the discomfort.   States he had xrays and MRI at [facility] maybe 5 years ago and was told had DDD.  Looking at old records, did have referral to Neurosurgery, but did not go as he ran out of insurance.   Also, has buring in right anterolateral chest wall for past 2 months.  Notes the burning in his axillae bilaterally.  The burning is not deep--similar to that in his axillae.  2.  GERD:  Now states his symptoms are in his right chest and abdominal wall as above.  Has a healthy diet, does not lie down after eating.  HOB not elevated.  Still smoking, no alcohol, no chocolate, Does eat tomatoes and onions .    3.  Essential Hypertension:  Taking Metoprolol and Amlodipine as written.  Getting 132/80 regularly now when check at home.  Getting physical activity and eating healthy    Current outpatient prescriptions:  .  amLODipine (NORVASC) 5 MG tablet, Take 1 tablet (5 mg total) by mouth daily., Disp: 30 tablet, Rfl: 11 .  metoprolol tartrate (LOPRESSOR) 25 MG tablet, Take 25 mg by mouth 2 (two) times daily. Prescribed starting 07/14/2014, Disp: , Rfl:  .  omeprazole (PRILOSEC) 20 MG capsule, 2 caps by mouth on empty stomach every night before bed, Disp: 60 capsule, Rfl: 11   No Known Allergies      Review of Systems     Objective:   Physical Exam  NAD HEENT:  PERRL, EOMI,  Neck:  Supple, no adenopathy Chest:  CTA CV:  RRR without murmur or rub, radial and DP pulses normal and equal. Abd:  S, Mild tenderness RUQ, no rebound or peritoneal signs, No HSM or mass Neuro:  A & O X 3, CN II-XII  grossly intact, DTRs 2+/4 throughout, Motor 5/5 throughout, though grip with ulnar fingers possibly a bit decreased.  Decreased sensation to light touch along ulnar forearms, middle, ring and smallest fingers bilaterally. MS:  Possible flattening of hypothenar eminence bilaterally--very mild if so.       Assessment & Plan:  1.  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  Will speak with Neurosurgery locally.  Consider [facility] as well.  MRI of C spine from 08/20/2013 with multilevel findings and cord flattening due to disc disease at C5-6  2.  GERD:  Some of what was considered GERD likely more radicular symptoms from spinal DDD with burning in right chest wall  Wean to Famotidine.  Discussed possibility of coming off Famotidine in future, but does have history of Barrett's esophagus and will likely need EGD to see where he is with this before complete stop. He will follow up in 2 months to see how doing.  Check CMP with fasting labs  3.  Essential Hypertension:  Controlled.  4.  Hyperlipidemia:  Needs to return for fasting labs next couple of weeks.

## 2015-06-30 ENCOUNTER — Other Ambulatory Visit: Payer: Self-pay

## 2015-06-30 DIAGNOSIS — K219 Gastro-esophageal reflux disease without esophagitis: Secondary | ICD-10-CM | POA: Insufficient documentation

## 2015-07-01 LAB — CBC WITH DIFFERENTIAL/PLATELET
BASOS ABS: 0.1 10*3/uL (ref 0.0–0.2)
Basos: 1 %
EOS (ABSOLUTE): 0.3 10*3/uL (ref 0.0–0.4)
Eos: 5 %
HEMOGLOBIN: 14.5 g/dL (ref 12.6–17.7)
Hematocrit: 43.9 % (ref 37.5–51.0)
IMMATURE GRANULOCYTES: 0 %
Immature Grans (Abs): 0 10*3/uL (ref 0.0–0.1)
LYMPHS: 48 %
Lymphocytes Absolute: 2.6 10*3/uL (ref 0.7–3.1)
MCH: 26.9 pg (ref 26.6–33.0)
MCHC: 33 g/dL (ref 31.5–35.7)
MCV: 81 fL (ref 79–97)
MONOCYTES: 5 %
Monocytes Absolute: 0.3 10*3/uL (ref 0.1–0.9)
NEUTROS ABS: 2.3 10*3/uL (ref 1.4–7.0)
NEUTROS PCT: 41 %
PLATELETS: 288 10*3/uL (ref 150–379)
RBC: 5.39 x10E6/uL (ref 4.14–5.80)
RDW: 16 % — ABNORMAL HIGH (ref 12.3–15.4)
WBC: 5.5 10*3/uL (ref 3.4–10.8)

## 2015-07-01 LAB — LIPID PANEL W/O CHOL/HDL RATIO
Cholesterol, Total: 221 mg/dL — ABNORMAL HIGH (ref 100–199)
HDL: 25 mg/dL — ABNORMAL LOW (ref >39)
LDL Calculated: 161 mg/dL — ABNORMAL HIGH (ref 0–99)
TRIGLYCERIDES: 175 mg/dL — AB (ref 0–149)
VLDL CHOLESTEROL CAL: 35 mg/dL (ref 5–40)

## 2015-07-01 LAB — COMPREHENSIVE METABOLIC PANEL
A/G RATIO: 1.9 (ref 1.2–2.2)
ALK PHOS: 87 IU/L (ref 39–117)
ALT: 23 IU/L (ref 0–44)
AST: 15 IU/L (ref 0–40)
Albumin: 4.6 g/dL (ref 3.5–5.5)
BUN/Creatinine Ratio: 16 (ref 9–20)
BUN: 12 mg/dL (ref 6–24)
Bilirubin Total: 0.4 mg/dL (ref 0.0–1.2)
CALCIUM: 9.6 mg/dL (ref 8.7–10.2)
CO2: 21 mmol/L (ref 18–29)
Chloride: 102 mmol/L (ref 96–106)
Creatinine, Ser: 0.75 mg/dL — ABNORMAL LOW (ref 0.76–1.27)
GFR calc Af Amer: 126 mL/min/{1.73_m2} (ref >59)
GFR calc non Af Amer: 109 mL/min/{1.73_m2} (ref >59)
GLOBULIN, TOTAL: 2.4 g/dL (ref 1.5–4.5)
Glucose: 98 mg/dL (ref 65–99)
POTASSIUM: 4.3 mmol/L (ref 3.5–5.2)
Sodium: 140 mmol/L (ref 134–144)
Total Protein: 7 g/dL (ref 6.0–8.5)

## 2015-07-08 ENCOUNTER — Telehealth: Payer: Self-pay | Admitting: Internal Medicine

## 2015-07-08 ENCOUNTER — Other Ambulatory Visit: Payer: Self-pay | Admitting: Internal Medicine

## 2015-07-08 DIAGNOSIS — M79621 Pain in right upper arm: Secondary | ICD-10-CM

## 2015-07-08 DIAGNOSIS — R200 Anesthesia of skin: Secondary | ICD-10-CM

## 2015-07-08 NOTE — Telephone Encounter (Signed)
Patient returned my call and is informed of appointment date, time, and location. Patient stated he will call Rene KocherRegina today. Left voice message today informing Rene KocherRegina that patient had been contacted by our office.

## 2015-07-08 NOTE — Telephone Encounter (Signed)
Regina from Dr. Huey RomansNund Kumar's office called to see if we had another contact number for patient. States she has been trying to reach patient 3 times already and patient has not been reached. Patient has appointment with them on 07/13/15 at 10:30 am. They will be closed Monday so they are trying to get a hold of patient. Patient needs to pay $200.00 up front and the rest will be billed to patient.  Raquel requested for us to try to contact patient and see if he might answer. I called patient and left a voice message to return call today before 5 o'clock.  Dr. Alease FrameNund Kumar office address is  [address], [place], [place] [address] If any questions Rene KocherRegina can be reach at Phone: [phone]

## 2015-07-13 ENCOUNTER — Other Ambulatory Visit: Payer: Self-pay | Admitting: Neurosurgery

## 2015-07-13 DIAGNOSIS — M4712 Other spondylosis with myelopathy, cervical region: Secondary | ICD-10-CM

## 2015-07-21 ENCOUNTER — Ambulatory Visit
Admission: RE | Admit: 2015-07-21 | Discharge: 2015-07-21 | Disposition: A | Discharge status: Home / Self Care | Payer: No Typology Code available for payment source | Source: Ambulatory Visit | Attending: Neurosurgery | Admitting: Neurosurgery

## 2015-07-21 DIAGNOSIS — M4712 Other spondylosis with myelopathy, cervical region: Secondary | ICD-10-CM

## 2015-07-22 ENCOUNTER — Telehealth: Payer: Self-pay

## 2015-07-22 NOTE — Telephone Encounter (Signed)
Usually with diverticulosis or diverticulitis, symptoms are in the left lower abdominal/pelvic area.  His symptoms previously were not in this area.  If his symptoms have changed to a different area, I would be happy to see him back.   I really think much of his difficulty after his last assessment is due to his cervical spine concerns.  Did he go to Neurosurgery yet? They were supposed to call if he decided it was too expensive.

## 2015-07-22 NOTE — Telephone Encounter (Signed)
Patient called this morning stating that the [place] treatment is not working. He says he has also changed his eating habits. Nothing has worked. He states he has done research and believes he has diverticulitis and wants to be evaluated for that. Please advise. Thank you.

## 2015-07-25 ENCOUNTER — Encounter: Payer: Self-pay | Admitting: Internal Medicine

## 2015-07-25 ENCOUNTER — Ambulatory Visit (INDEPENDENT_AMBULATORY_CARE_PROVIDER_SITE_OTHER): Payer: Self-pay | Admitting: Internal Medicine

## 2015-07-25 VITALS — BP 142/78 | HR 64 | Resp 16 | Ht 65.75 in | Wt 193.0 lb

## 2015-07-25 DIAGNOSIS — R1011 Right upper quadrant pain: Secondary | ICD-10-CM

## 2015-07-25 NOTE — Progress Notes (Signed)
   Subjective:    Patient ID: Billy [name], male    DOB: 03-Sep-1965, 50 y.o.   MRN: [id number]  HPI   Right abdominal discomfort, not the left lower quad discomfort we thought he was describing over the phone.  Describes a deep ache and does state it seems contiguous with the burning discomfort of his right chest and axilla. Seems worse when his cervical radicular symptoms are worse as well. He did see Neurosurgery and underwent MRI of cspine.  They are recommending surgical intervention, but need an updated view of cspine.   Current outpatient prescriptions:  .  acetaminophen (TYLENOL) 500 MG tablet, Take 500 mg by mouth every 6 (six) hours as needed., Disp: , Rfl:  .  amLODipine (NORVASC) 5 MG tablet, Take 1 tablet (5 mg total) by mouth daily., Disp: 30 tablet, Rfl: 11 .  famotidine (PEPCID) 20 MG tablet, 2 tabs by mouth at bedtime, Disp: 60 tablet, Rfl: 4 .  metoprolol tartrate (LOPRESSOR) 25 MG tablet, Take 1 tablet (25 mg total) by mouth 2 (two) times daily., Disp: 60 tablet, Rfl: 11   No Known Allergies      Review of Systems     Objective:   Physical Exam Lungs:  CTA CV:RRR without murmur or rub, radial pulses normal and equal Abd:  S, really NT, No HSM or mass, + BS       Assessment & Plan:  Likely referred pain:  Historically, not diverticulosis or diverticulitis. Pt. To pay attention to when he has the pain and to let us know if seems separate from his cervical radicular symptoms

## 2015-07-25 NOTE — Patient Instructions (Signed)
Call in 1-2 weeks and let us know if symptoms seem to correspond with other symptoms related to cervical spine.

## 2015-07-26 DIAGNOSIS — R109 Unspecified abdominal pain: Secondary | ICD-10-CM | POA: Insufficient documentation

## 2015-08-07 ENCOUNTER — Other Ambulatory Visit: Payer: Self-pay | Admitting: Internal Medicine

## 2015-08-29 ENCOUNTER — Ambulatory Visit (INDEPENDENT_AMBULATORY_CARE_PROVIDER_SITE_OTHER): Payer: Self-pay | Admitting: Internal Medicine

## 2015-08-29 ENCOUNTER — Encounter: Payer: Self-pay | Admitting: Internal Medicine

## 2015-08-29 VITALS — BP 138/88 | HR 68 | Resp 16 | Ht 65.75 in | Wt 188.0 lb

## 2015-08-29 DIAGNOSIS — M4802 Spinal stenosis, cervical region: Secondary | ICD-10-CM

## 2015-08-29 DIAGNOSIS — I10 Essential (primary) hypertension: Secondary | ICD-10-CM

## 2015-08-29 NOTE — Progress Notes (Signed)
   Subjective:    Patient ID: Billy [name], male    DOB: [birth year], 50 y.o.   MRN: [id number]  HPI   1.  Cspine disease:  Has been reevaluted by [facility], Dr. Conchita ParisNundkumar.  MRI showed decreased or block of spinal fluid--pt. Show's me a photocopy of the MRI, sagittal view.   He is waiting to hear back--left message last week on status of the surgery, which sounds definite at this time.    2.  Right abdominal symptoms:  Discussed again that seems contiguous with axillary symptoms that seem related to his Cspine pain.  Suspect referred pain.   Not having heartburn symptoms or acid in is mouth.  Not having the pain in his right chest and abdomen today.  3.  Essential Hypertension:  Forgot to take medication this morning and was talking to [employer] this morning as well as drinking coffee.  He states his bp at home is 130/something--does not pay attention to the diastolic.   Repeat of bp was fine after time to sit.   Current outpatient prescriptions:  .  acetaminophen (TYLENOL) 500 MG tablet, Take 500 mg by mouth every 6 (six) hours as needed., Disp: , Rfl:  .  amLODipine (NORVASC) 5 MG tablet, Take 1 tablet (5 mg total) by mouth daily., Disp: 30 tablet, Rfl: 11 .  famotidine (PEPCID) 20 MG tablet, 2 tabs by mouth at bedtime, Disp: 60 tablet, Rfl: 4 .  metoprolol tartrate (LOPRESSOR) 25 MG tablet, Take 1 tablet (25 mg total) by mouth 2 (two) times daily., Disp: 60 tablet, Rfl: 11   No Known Allergies         Review of Systems     Objective:   Physical Exam NAD Lungs:  CTA CV:  RRR without murmur or rub.  Radial and DP pulses normal and equal.   Abd:  S, NT, No HSM or mass, + BS        Assessment & Plan:  1.  Cspine disease:  As per Dr. Conchita ParisNundkumar.  Consider Gabapentin for pain if no surgery planned soon  2.  Essential Hypertension: BP fine on recheck.  3.  ?GERD:  Again, discussed with patient I do not think the pain he is having is GERD or GI  related.  Feel pain related to his Cspine disease.  Pain contiguous with the burning pain from his right axilla and upper chest.  Seems to be in the abdominal wall and not deep.

## 2015-08-29 NOTE — Patient Instructions (Signed)
Call if there is a delay in surgery and having difficulties with pain in right chest/abdominal wall.

## 2015-08-31 DIAGNOSIS — M4802 Spinal stenosis, cervical region: Secondary | ICD-10-CM | POA: Insufficient documentation

## 2015-11-07 ENCOUNTER — Other Ambulatory Visit (INDEPENDENT_AMBULATORY_CARE_PROVIDER_SITE_OTHER): Payer: Self-pay

## 2015-11-07 DIAGNOSIS — E785 Hyperlipidemia, unspecified: Secondary | ICD-10-CM

## 2015-11-08 LAB — LIPID PANEL W/O CHOL/HDL RATIO
Cholesterol, Total: 204 mg/dL — ABNORMAL HIGH (ref 100–199)
HDL: 27 mg/dL — ABNORMAL LOW (ref >39)
LDL CALC: 147 mg/dL — AB (ref 0–99)
Triglycerides: 148 mg/dL (ref 0–149)
VLDL CHOLESTEROL CAL: 30 mg/dL (ref 5–40)

## 2016-01-06 ENCOUNTER — Telehealth: Payer: Self-pay | Admitting: Internal Medicine

## 2016-01-06 DIAGNOSIS — I10 Essential (primary) hypertension: Secondary | ICD-10-CM

## 2016-01-06 MED ORDER — AMLODIPINE BESYLATE 5 MG PO TABS: 5.0000 mg | ORAL_TABLET | Freq: Every day | ORAL | 11 refills | Status: DC

## 2016-01-06 NOTE — Telephone Encounter (Signed)
See telephone note.

## 2016-07-10 ENCOUNTER — Telehealth: Payer: Self-pay | Admitting: Internal Medicine

## 2016-07-10 NOTE — Telephone Encounter (Signed)
Patient would like a refill for metoprolol tartrate (LOPRESSOR) 25 mg. Tablet.  Patient would like to know if he needs to take the metoprolol tartrate and amLODipine for the rest of his life.

## 2016-07-13 ENCOUNTER — Other Ambulatory Visit: Payer: Self-pay

## 2016-07-13 DIAGNOSIS — I10 Essential (primary) hypertension: Secondary | ICD-10-CM

## 2016-07-13 MED ORDER — METOPROLOL TARTRATE 25 MG PO TABS: 25.0000 mg | ORAL_TABLET | Freq: Two times a day (BID) | ORAL | 11 refills | Status: DC

## 2016-07-13 NOTE — Telephone Encounter (Signed)
Rx faxed to pharmacy. Please call patient needs a follow up appointment with Dr. Delrae AlfredMulberry. She will be able to discuss with him if he needs to be on medication for the rest of his life.

## 2016-07-27 ENCOUNTER — Other Ambulatory Visit: Payer: Self-pay | Admitting: Internal Medicine

## 2016-07-27 DIAGNOSIS — I10 Essential (primary) hypertension: Secondary | ICD-10-CM

## 2016-07-27 NOTE — Telephone Encounter (Signed)
Patient needs to make an appt before will fill any more of this med. Last appt. In July 2017

## 2016-09-11 ENCOUNTER — Telehealth: Payer: Self-pay | Admitting: Internal Medicine

## 2016-09-11 NOTE — Telephone Encounter (Signed)
Patient would like a referral to Neuro Surgen.  Would like to see Dr. Ardelle AntonKilpartick in [facility], [address], in [place].

## 2016-09-11 NOTE — Telephone Encounter (Signed)
Patient wife would like to be called at [phone] with regards to referral to Neuro Surgen w/ Dr. Petra KubaKilpatrick.

## 2016-09-11 NOTE — Telephone Encounter (Signed)
Patient needs appointment to discuss. Patient has not been seen in over a year

## 2017-01-01 ENCOUNTER — Other Ambulatory Visit: Payer: Self-pay | Admitting: Internal Medicine

## 2017-01-01 DIAGNOSIS — I10 Essential (primary) hypertension: Secondary | ICD-10-CM

## 2017-12-17 ENCOUNTER — Other Ambulatory Visit: Payer: Self-pay | Admitting: Internal Medicine

## 2017-12-17 DIAGNOSIS — I10 Essential (primary) hypertension: Secondary | ICD-10-CM

## 2017-12-25 ENCOUNTER — Encounter: Payer: Self-pay | Admitting: Internal Medicine

## 2017-12-25 ENCOUNTER — Ambulatory Visit: Payer: Self-pay | Admitting: Internal Medicine

## 2017-12-25 ENCOUNTER — Ambulatory Visit (HOSPITAL_COMMUNITY)
Admission: RE | Admit: 2017-12-25 | Discharge: 2017-12-25 | Disposition: A | Discharge status: Home / Self Care | Payer: Self-pay | Source: Ambulatory Visit | Attending: Internal Medicine | Admitting: Internal Medicine

## 2017-12-25 VITALS — BP 130/80 | HR 62 | Resp 12 | Ht 66.0 in | Wt 180.0 lb

## 2017-12-25 DIAGNOSIS — R059 Cough, unspecified: Secondary | ICD-10-CM

## 2017-12-25 DIAGNOSIS — K2100 Gastro-esophageal reflux disease with esophagitis, without bleeding: Secondary | ICD-10-CM

## 2017-12-25 DIAGNOSIS — I10 Essential (primary) hypertension: Secondary | ICD-10-CM

## 2017-12-25 DIAGNOSIS — R05 Cough: Secondary | ICD-10-CM

## 2017-12-25 DIAGNOSIS — F17200 Nicotine dependence, unspecified, uncomplicated: Secondary | ICD-10-CM

## 2017-12-25 DIAGNOSIS — R0789 Other chest pain: Secondary | ICD-10-CM

## 2017-12-25 DIAGNOSIS — M4802 Spinal stenosis, cervical region: Secondary | ICD-10-CM

## 2017-12-25 DIAGNOSIS — Z79899 Other long term (current) drug therapy: Secondary | ICD-10-CM

## 2017-12-25 DIAGNOSIS — J984 Other disorders of lung: Secondary | ICD-10-CM | POA: Insufficient documentation

## 2017-12-25 MED ORDER — OMEPRAZOLE 40 MG PO CPDR: DELAYED_RELEASE_CAPSULE | ORAL | 11 refills | Status: AC

## 2017-12-25 MED ORDER — AMLODIPINE BESYLATE 5 MG PO TABS: 5.0000 mg | ORAL_TABLET | Freq: Every day | ORAL | 11 refills | Status: AC

## 2017-12-25 MED ORDER — FEXOFENADINE HCL 180 MG PO TABS: 180.0000 mg | ORAL_TABLET | Freq: Every day | ORAL | Status: AC

## 2017-12-25 MED ORDER — METOPROLOL SUCCINATE ER 50 MG PO TB24: 50.0000 mg | ORAL_TABLET | Freq: Every day | ORAL | 11 refills | Status: AC

## 2017-12-25 NOTE — Patient Instructions (Signed)
Gastroesophageal Reflux Disease, Adult Normally, food travels down the esophagus and stays in the stomach to be digested. However, when a person has gastroesophageal reflux disease (GERD), food and stomach acid move back up into the esophagus. When this happens, the esophagus becomes sore and inflamed. Over time, GERD can create small holes (ulcers) in the lining of the esophagus. What are the causes? This condition is caused by a problem with the muscle between the esophagus and the stomach (lower esophageal sphincter, or LES). Normally, the LES muscle closes after food passes through the esophagus to the stomach. When the LES is weakened or abnormal, it does not close properly, and that allows food and stomach acid to go back up into the esophagus. The LES can be weakened by certain dietary substances, medicines, and medical conditions, including:  Tobacco use.  Pregnancy.  Having a hiatal hernia.  Heavy alcohol use.  Certain foods and beverages, such as coffee, chocolate, onions, and peppermint.  What increases the risk? This condition is more likely to develop in:  People who have an increased body weight.  People who have connective tissue disorders.  People who use NSAID medicines.  What are the signs or symptoms? Symptoms of this condition include:  Heartburn.  Difficult or painful swallowing.  The feeling of having a lump in the throat.  Abitter taste in the mouth.  Bad breath.  Having a large amount of saliva.  Having an upset or bloated stomach.  Belching.  Chest pain.  Shortness of breath or wheezing.  Ongoing (chronic) cough or a night-time cough.  Wearing away of tooth enamel.  Weight loss.  Different conditions can cause chest pain. Make sure to see your health care provider if you experience chest pain. How is this diagnosed? Your health care provider will take a medical history and perform a physical exam. To determine if you have mild or severe  GERD, your health care provider may also monitor how you respond to treatment. You may also have other tests, including:  An endoscopy toexamine your stomach and esophagus with a small camera.  A test thatmeasures the acidity level in your esophagus.  A test thatmeasures how much pressure is on your esophagus.  A barium swallow or modified barium swallow to show the shape, size, and functioning of your esophagus.  How is this treated? The goal of treatment is to help relieve your symptoms and to prevent complications. Treatment for this condition may vary depending on how severe your symptoms are. Your health care provider may recommend:  Changes to your diet.  Medicine.  Surgery.  Follow these instructions at home: Diet  Follow a diet as recommended by your health care provider. This may involve avoiding foods and drinks such as: ? Coffee and tea (with or without caffeine). ? Drinks that containalcohol. ? Energy drinks and sports drinks. ? Carbonated drinks or sodas. ? Chocolate and cocoa. ? Peppermint and mint flavorings. ? Garlic and onions. ? Horseradish. ? Spicy and acidic foods, including peppers, chili powder, curry powder, vinegar, hot sauces, and barbecue sauce. ? Citrus fruit juices and citrus fruits, such as oranges, lemons, and limes. ? Tomato-based foods, such as red sauce, chili, salsa, and pizza with red sauce. ? Fried and fatty foods, such as donuts, french fries, potato chips, and high-fat dressings. ? High-fat meats, such as hot dogs and fatty cuts of red and white meats, such as rib eye steak, sausage, ham, and bacon. ? High-fat dairy items, such as whole milk,   butter, and cream cheese.  Eat small, frequent meals instead of large meals.  Avoid drinking large amounts of liquid with your meals.  Avoid eating meals during the 2-3 hours before bedtime.  Avoid lying down right after you eat.  Do not exercise right after you eat. General  instructions  Pay attention to any changes in your symptoms.  Take over-the-counter and prescription medicines only as told by your health care provider. Do not take aspirin, ibuprofen, or other NSAIDs unless your health care provider told you to do so.  Do not use any tobacco products, including cigarettes, chewing tobacco, and e-cigarettes. If you need help quitting, ask your health care provider.  Wear loose-fitting clothing. Do not wear anything tight around your waist that causes pressure on your abdomen.  Raise (elevate) the head of your bed 6 inches (15cm).  Try to reduce your stress, such as with yoga or meditation. If you need help reducing stress, ask your health care provider.  If you are overweight, reduce your weight to an amount that is healthy for you. Ask your health care provider for guidance about a safe weight loss goal.  Keep all follow-up visits as told by your health care provider. This is important. Contact a health care provider if:  You have new symptoms.  You have unexplained weight loss.  You have difficulty swallowing, or it hurts to swallow.  You have wheezing or a persistent cough.  Your symptoms do not improve with treatment.  You have a hoarse voice. Get help right away if:  You have pain in your arms, neck, jaw, teeth, or back.  You feel sweaty, dizzy, or light-headed.  You have chest pain or shortness of breath.  You vomit and your vomit looks like blood or coffee grounds.  You faint.  Your stool is bloody or black.  You cannot swallow, drink, or eat. This information is not intended to replace advice given to you by your health care provider. Make sure you discuss any questions you have with your health care provider. Document Released: 11/08/2004 Document Revised: 06/29/2015 Document Reviewed: 05/26/2014 Elsevier Interactive Patient Education  2018 [employer]  

## 2017-12-25 NOTE — Progress Notes (Signed)
Subjective:    Patient ID: Billy [name], male    DOB: Jan 27, 1966, 52 y.o.   MRN: [id number]  HPI   1.  Pain in right chest, a recurrent issue.  Hurts around lateral chest and up to upper shoulder blade.  Worse when lying down.  Feels like a muscle cramp like after working out.   Was taking Zantac for GERD until recall came out.  His right chest symptoms worsened after he stopped the Zantac.  Has been taking Omeprazole 20 mg daily, which he has been taking for some time--maybe a year ago.    No melena or hematochezia.   No nausea No dyspnea, though he is coughing, especially at night after lying down.  Denies acid in throat. Has lost weight, but was purposeful.  Changed diet--not eating fast food any longer.   Continues to smoke 1 ppd. He would like to quit. Drinks coffee 2 cups daily.  No sodas Does not eat onions or tomatoes on regular basis. Does take Aleve and has been taking for past 2 weeks.   No alcohol  Eats chocolate a lot in past 2 weeks. Does also have history of Barret's esophagus. He also has significant diffuse DDD of cervical spine which has not been addressed by Neurosurgery due to cost.  He would be willing to go [facility] when he returns to [place] end of [place]. Does not currently have an orange card.  2.  Hypertension:  Has lost weight as above and also taking Amlodipine and Metoprolol regularly. He prefers the once daily dosing with the Metoprolol.  Current Meds  Medication Sig  . amLODipine (NORVASC) 5 MG tablet TAKE ONE TABLET BY MOUTH ONCE DAILY  . metoprolol succinate (TOPROL-XL) 50 MG 24 hr tablet Take by mouth daily.   Marland Kitchen. omeprazole (PRILOSEC) 20 MG capsule TAKE 1 CAPSULE BY MOUTH DAILY.  . [DISCONTINUED] metoprolol tartrate (LOPRESSOR) 25 MG tablet Take 1 tablet (25 mg total) by mouth 2 (two) times daily.    No Known Allergies Review of Systems     Objective:   Physical Exam  Congested cough at times HEENT: PERRL, EOMI, TMs pearly  gray, throat without injection.  Nasal mucosa boggy with clear to white discharge. Neck:  Supple, No adenopathy Chest:  CTA.  Chest wall NT with AP and bilateral compression of rib cage.  Quite tender and palpable muscle spasm of muscles all around right scapula, particularly just inferior and running under the scapula.  Reproduces pain he described. CV:  RRR with normal S1 and S2, No S3, S4 or murmur.  Radial and DP pulses normal and equal Abd:  S, mild epigastric tenderness.  No HSM or mass, + BS      Assessment & Plan:  1.  GERD:  Needs GI referral.  Will get orange card and let me know when has that for referral to GI.  Needs EGD Reflux precautions. Increase Omeprazole to 40 mg daily.  2.  Muscle spasm of right thoracic back with history of cervical stenosis:  Also complaining of bilateral hand numbness worsening.  Referral [facility] as could not afford the cost for surgery here locally.  Was seen by NS back in 2017 when last a patient here. 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  3.  Hypertension:  Refill meds.  [facility]oup[facility]  4.  Cough:  Discussed options for tobacco cessation.  Recommended quit line and to see if can get patches  through them.  To call if not. CXR with tobacco use. Also appears to have nasal findings for allergies:  To try Fexofenadine 180 mg daily and see if beneficial  5.  Tobacco abuse:  As above.

## 2017-12-26 ENCOUNTER — Ambulatory Visit (HOSPITAL_COMMUNITY)
Admission: RE | Admit: 2017-12-26 | Discharge: 2017-12-26 | Disposition: A | Discharge status: Home / Self Care | Payer: Self-pay | Source: Ambulatory Visit | Attending: Internal Medicine | Admitting: Internal Medicine

## 2017-12-26 ENCOUNTER — Other Ambulatory Visit: Payer: Self-pay | Admitting: Internal Medicine

## 2017-12-26 DIAGNOSIS — R9389 Abnormal findings on diagnostic imaging of other specified body structures: Secondary | ICD-10-CM

## 2017-12-26 DIAGNOSIS — R059 Cough, unspecified: Secondary | ICD-10-CM

## 2017-12-26 DIAGNOSIS — R05 Cough: Secondary | ICD-10-CM

## 2017-12-26 LAB — CBC WITH DIFFERENTIAL/PLATELET
Basophils Absolute: 0.1 10*3/uL (ref 0.0–0.2)
Basos: 1 %
EOS (ABSOLUTE): 0.3 10*3/uL (ref 0.0–0.4)
Eos: 5 %
HEMOGLOBIN: 14.1 g/dL (ref 13.0–17.7)
Hematocrit: 43.2 % (ref 37.5–51.0)
IMMATURE GRANS (ABS): 0 10*3/uL (ref 0.0–0.1)
Immature Granulocytes: 0 %
Lymphocytes Absolute: 2.7 10*3/uL (ref 0.7–3.1)
Lymphs: 44 %
MCH: 26.7 pg (ref 26.6–33.0)
MCHC: 32.6 g/dL (ref 31.5–35.7)
MCV: 82 fL (ref 79–97)
MONOS ABS: 0.6 10*3/uL (ref 0.1–0.9)
Monocytes: 9 %
NEUTROS ABS: 2.5 10*3/uL (ref 1.4–7.0)
Neutrophils: 41 %
Platelets: 280 10*3/uL (ref 150–450)
RBC: 5.28 x10E6/uL (ref 4.14–5.80)
RDW: 16.2 % — ABNORMAL HIGH (ref 12.3–15.4)
WBC: 6.1 10*3/uL (ref 3.4–10.8)

## 2017-12-26 LAB — COMPREHENSIVE METABOLIC PANEL
A/G RATIO: 2.3 — AB (ref 1.2–2.2)
ALBUMIN: 4.5 g/dL (ref 3.5–5.5)
ALT: 30 IU/L (ref 0–44)
AST: 18 IU/L (ref 0–40)
Alkaline Phosphatase: 72 IU/L (ref 39–117)
BILIRUBIN TOTAL: 0.3 mg/dL (ref 0.0–1.2)
BUN / CREAT RATIO: 14 (ref 9–20)
BUN: 11 mg/dL (ref 6–24)
CALCIUM: 9.3 mg/dL (ref 8.7–10.2)
CHLORIDE: 104 mmol/L (ref 96–106)
CO2: 22 mmol/L (ref 20–29)
Creatinine, Ser: 0.77 mg/dL (ref 0.76–1.27)
GFR calc non Af Amer: 106 mL/min/{1.73_m2} (ref >59)
GFR, EST AFRICAN AMERICAN: 122 mL/min/{1.73_m2} (ref >59)
Globulin, Total: 2 g/dL (ref 1.5–4.5)
Glucose: 63 mg/dL — ABNORMAL LOW (ref 65–99)
Potassium: 4.9 mmol/L (ref 3.5–5.2)
Sodium: 143 mmol/L (ref 134–144)
TOTAL PROTEIN: 6.5 g/dL (ref 6.0–8.5)

## 2017-12-26 NOTE — Progress Notes (Signed)
Recommended to have a noncontrast CT of chest to evaluate left lung density.

## 2017-12-26 NOTE — Progress Notes (Signed)
Density in left lung field--needs to return for xray with nipple markers.

## 2018-01-06 ENCOUNTER — Ambulatory Visit (HOSPITAL_COMMUNITY)
Admission: RE | Admit: 2018-01-06 | Discharge: 2018-01-06 | Disposition: A | Discharge status: Home / Self Care | Payer: Self-pay | Source: Ambulatory Visit | Attending: Internal Medicine | Admitting: Internal Medicine

## 2018-01-06 DIAGNOSIS — R05 Cough: Secondary | ICD-10-CM | POA: Insufficient documentation

## 2018-01-06 DIAGNOSIS — R059 Cough, unspecified: Secondary | ICD-10-CM

## 2018-01-06 DIAGNOSIS — R9389 Abnormal findings on diagnostic imaging of other specified body structures: Secondary | ICD-10-CM | POA: Insufficient documentation

## 2018-01-07 ENCOUNTER — Encounter: Payer: Self-pay | Admitting: Internal Medicine

## 2018-02-17 ENCOUNTER — Ambulatory Visit: Payer: Self-pay | Admitting: Internal Medicine

## 2018-03-21 ENCOUNTER — Ambulatory Visit: Payer: Self-pay | Admitting: Internal Medicine

## 2018-12-15 ENCOUNTER — Ambulatory Visit: Payer: Self-pay

## 2020-06-07 IMAGING — CR DG CHEST 2V
2 series · 2 of 2 positions shown · non-contrast
Comparison: Film from earlier in the same day.

CLINICAL DATA: Follow-up left basilar lung density

EXAM:
CHEST - 2 VIEW

[chest pa]
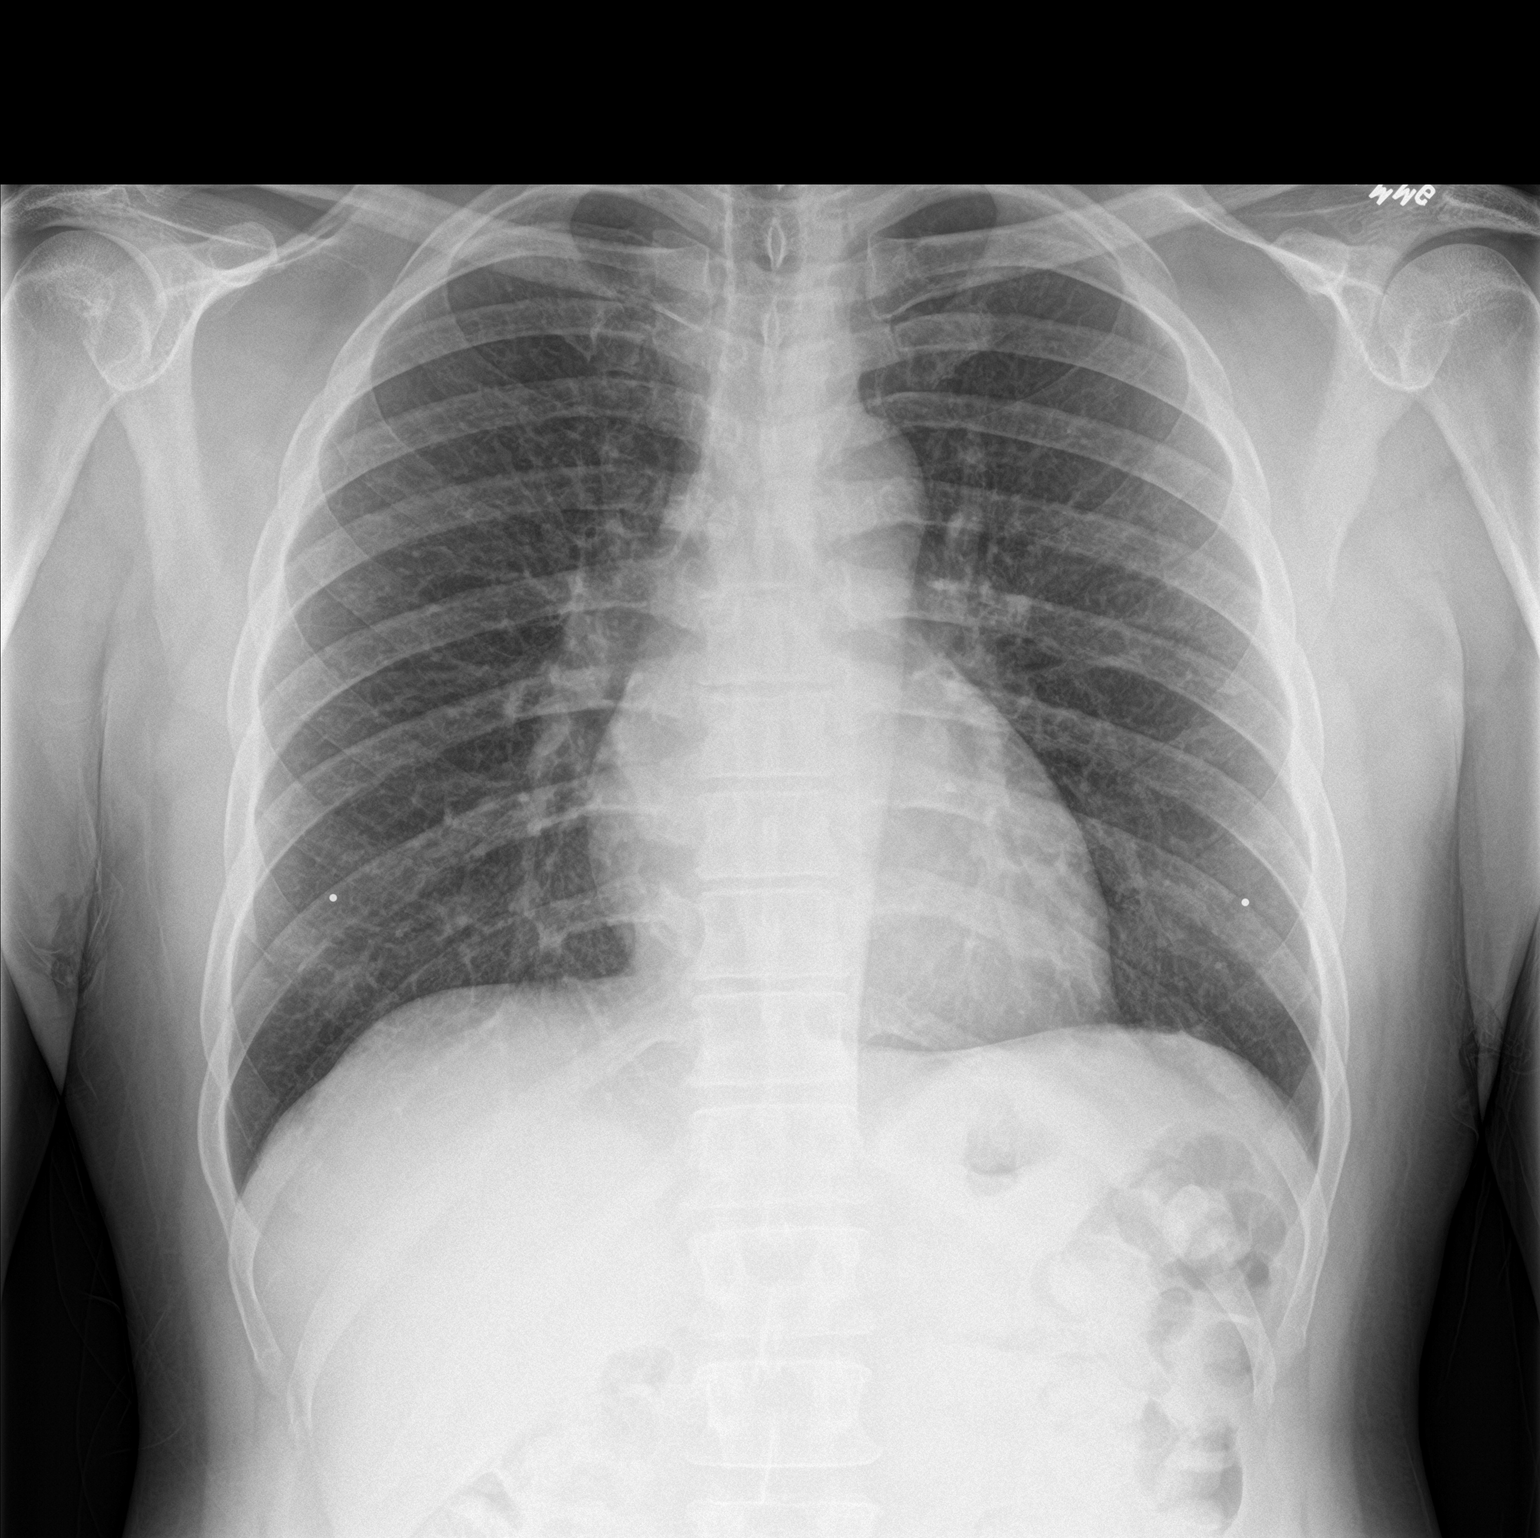

[chest lat]
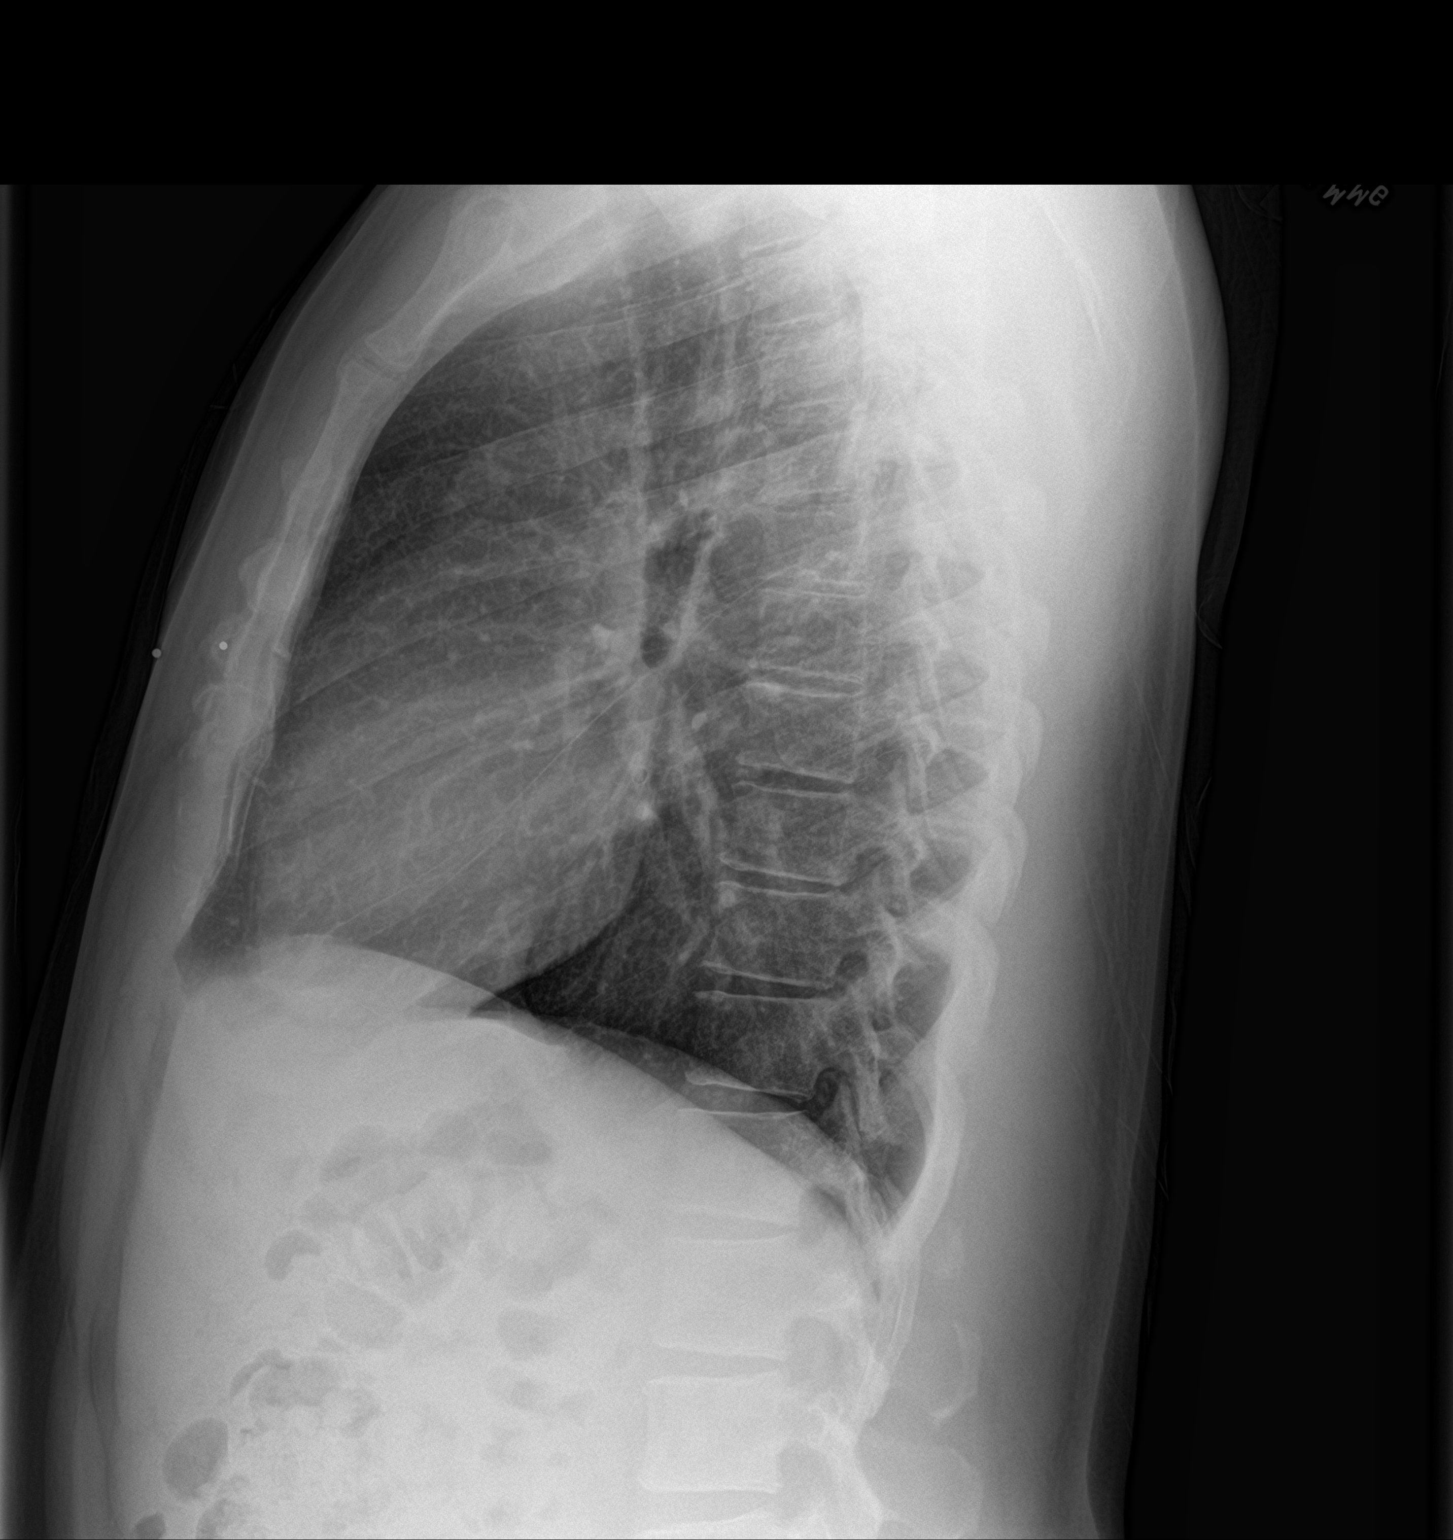

[2 of 2 positions shown; findings below may reference images not displayed]

FINDINGS: Cardiac shadow is stable. The lungs are well aerated. Bilateral
nipple markers are now seen. The previously noted left basilar
density is obscured by the diaphragm but does not correspond to the
left nipple. This is not well seen on the lateral projection. No
bony abnormality is seen.
IMPRESSION: Previously seen density is less well appreciated but does not
correspond to the left nipple. Noncontrast chest CT would be helpful
for further evaluation.

## 2020-06-18 IMAGING — CT CT CHEST W/O CM
2 of 3 series · 15 of 36 positions shown, 18 images · non-contrast
Comparison: Chest x-rays dated 12/26/2017 and 12/25/2017

CLINICAL DATA: Abnormal chest radiograph dated 12/25/2017. Possible
pulmonary nodule at the left lung base.

EXAM:
CT CHEST WITHOUT CONTRAST
TECHNIQUE: Multidetector CT imaging of the chest was performed following the
standard protocol without IV contrast.

[Series 4: thorax 2.0 · axial · 0.67mm/px · z∈[+1295,+1565]mm · 12 of 159 slices shown, 15 images]
[im 12/159  mediastinal]
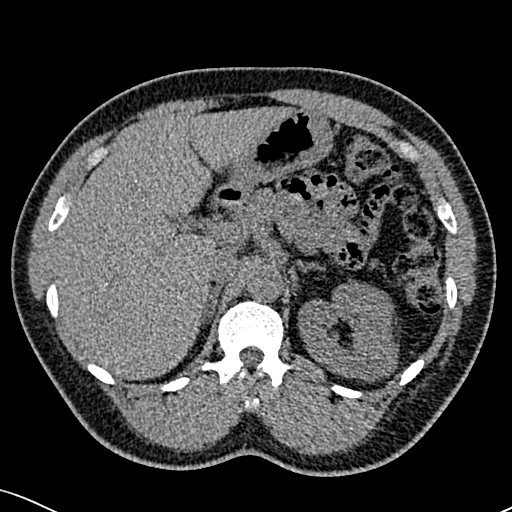
[im 12/159  lung]
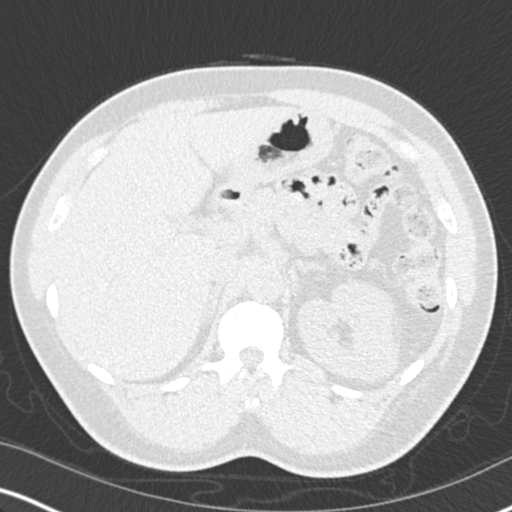
[im 24/159  lung]
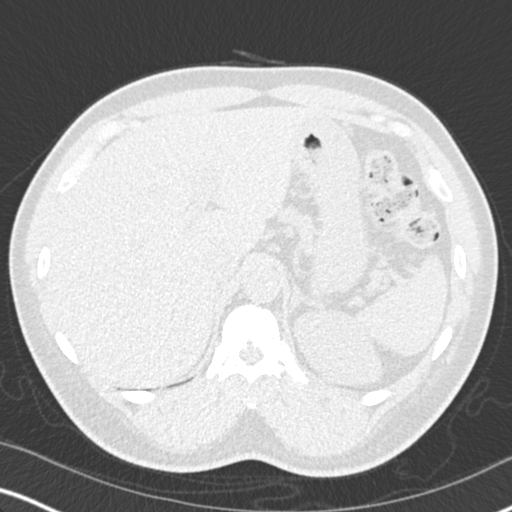
[im 36/159  lung]
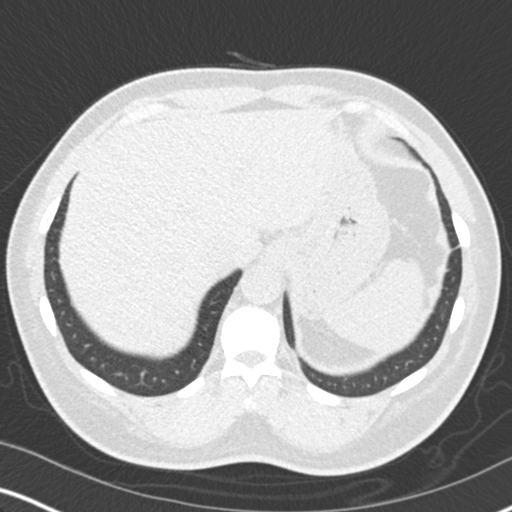
[im 47/159  lung]
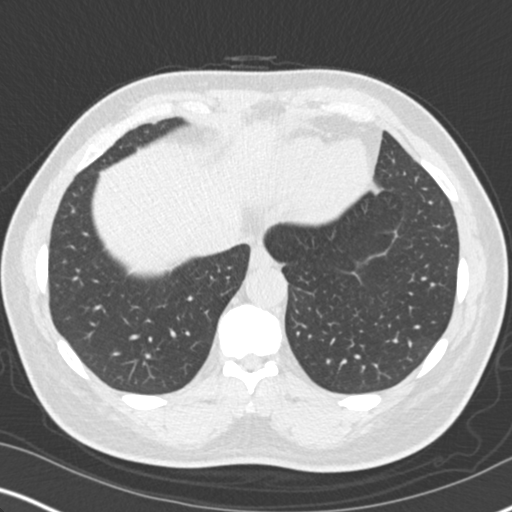
[im 59/159  mediastinal]
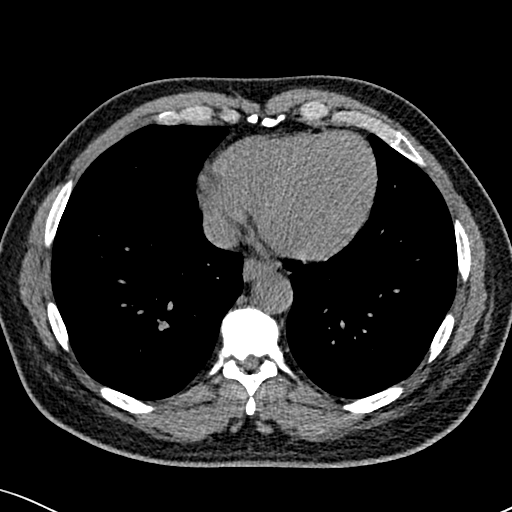
[im 59/159  lung]
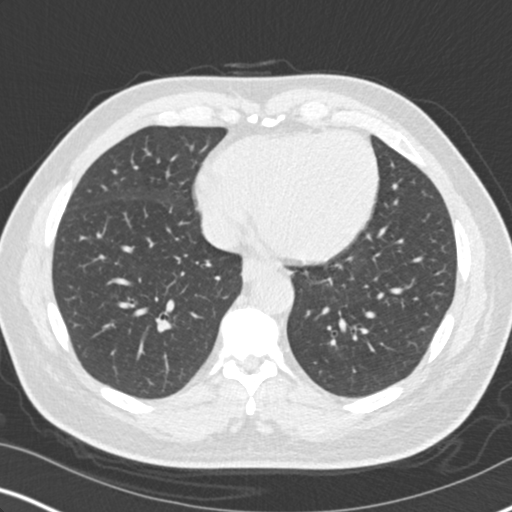
[im 71/159  lung]
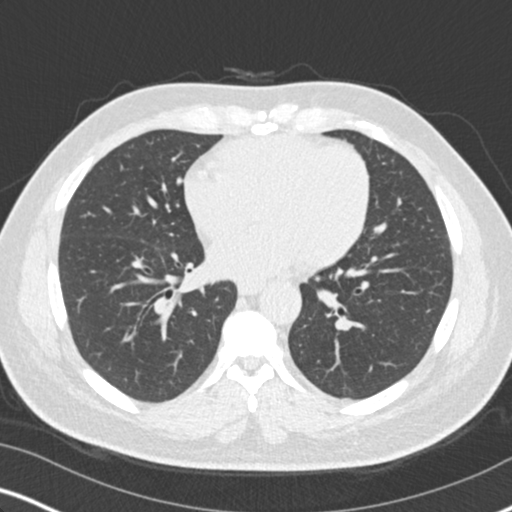
[im 88/159  lung]
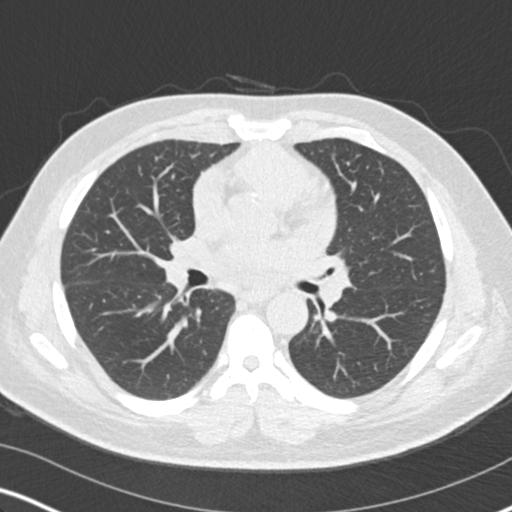
[im 100/159  lung]
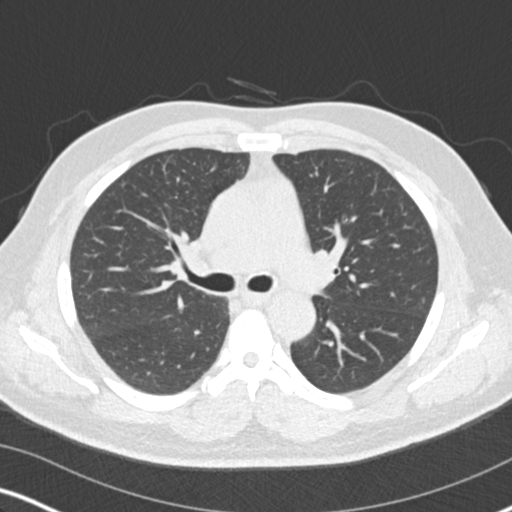
[im 112/159  mediastinal]
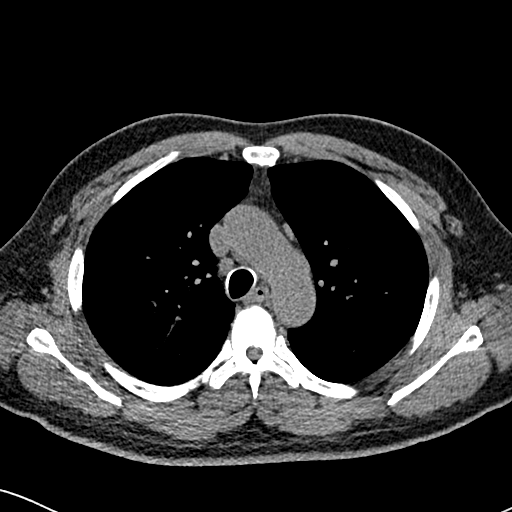
[im 112/159  lung]
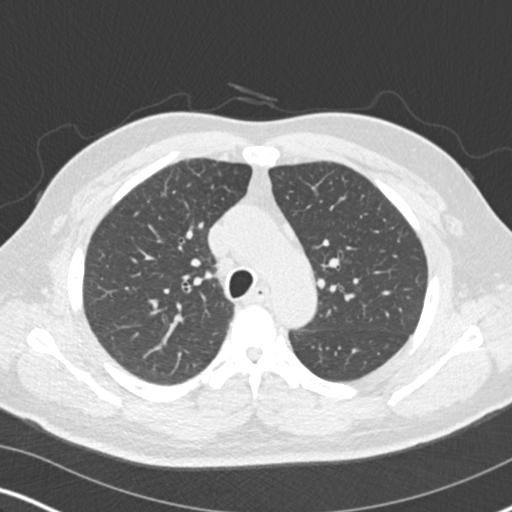
[im 123/159  lung]
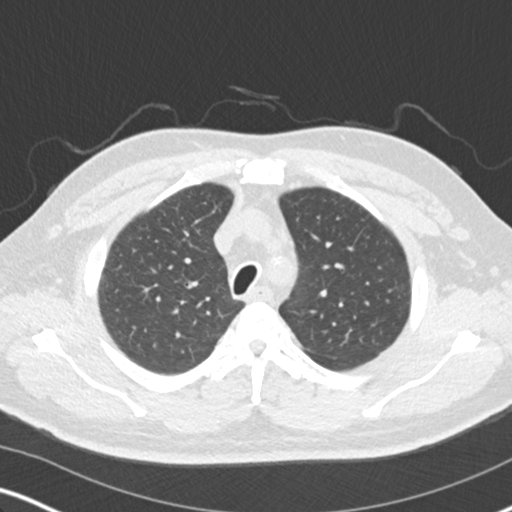
[im 135/159  lung]
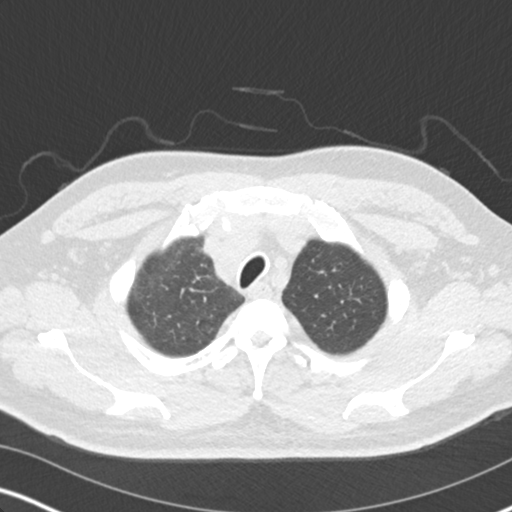
[im 147/159  lung]
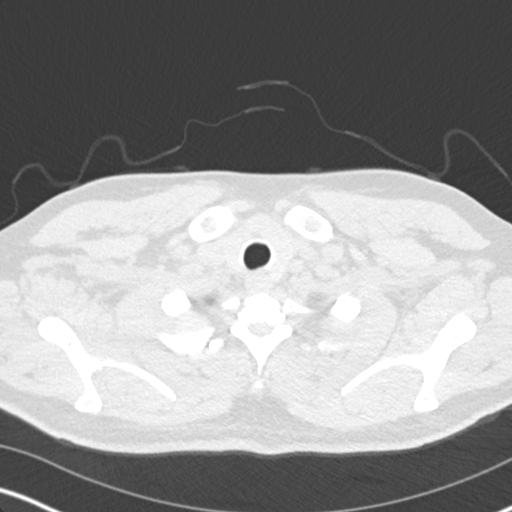

[Series 6: coronal · coronal · 0.62mm/px · 3 of 101 slices shown]
[im 21/101  lung]
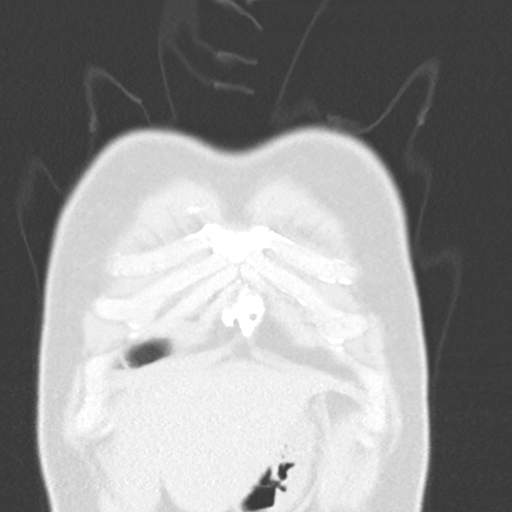
[im 41/101  lung]
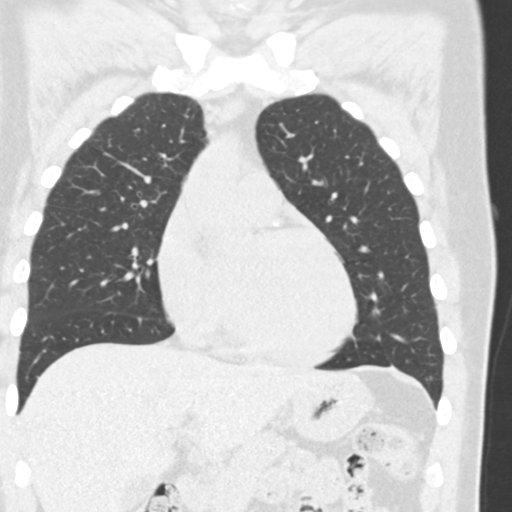
[im 61/101  lung]
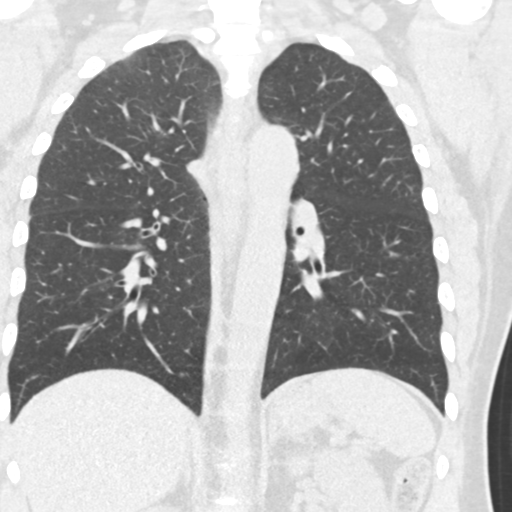

[15 of 36 positions shown; findings below may reference images not displayed]

FINDINGS: Cardiovascular: Slight calcification in the thoracic aorta and
coronary arteries. Normal heart size. No pericardial effusion.

Mediastinum/Nodes: No enlarged mediastinal or axillary lymph nodes.
Thyroid gland, trachea, and esophagus demonstrate no significant
findings.

Lungs/Pleura: There is a 5 mm nodule in the right upper lobe
posterior medially on image 41 of series 7. The lungs are otherwise
clear. The density at the left lung base on the chest x-ray of
12/25/2017 is felt to represent calcification in the costal
cartilage of the anterior aspect of the left seventh rib. No
effusions.

Upper Abdomen: Negative.

Musculoskeletal: No chest wall mass or suspicious bone lesions
identified.
IMPRESSION: 1. Solitary 5 mm nodule in the posterior aspect of the right upper
lobe. No other pulmonary nodules. Specifically, no evidence of a
pulmonary nodule at the left lung base. No follow-up needed if
patient is low-risk. Non-contrast chest CT can be considered in 12
months if patient is high-risk. This recommendation follows the
consensus statement: Guidelines for Management of Incidental
Pulmonary Nodules Detected on CT Images: From the [HOSPITAL]
2. Slight calcification in the thoracic aorta and coronary arteries.

## 2023-12-17 LAB — COLOGUARD: COLOGUARD: NEGATIVE
